# Patient Record
Sex: Female | Born: 1935
Health system: Southern US, Community
[De-identification: ages and names within clinical notes are randomized; demographics above are authoritative.]

## PROBLEM LIST (undated history)

## (undated) DIAGNOSIS — J189 Pneumonia, unspecified organism: Secondary | ICD-10-CM

## (undated) DIAGNOSIS — M109 Gout, unspecified: Secondary | ICD-10-CM

## (undated) DIAGNOSIS — R112 Nausea with vomiting, unspecified: Secondary | ICD-10-CM

## (undated) DIAGNOSIS — Z86718 Personal history of other venous thrombosis and embolism: Secondary | ICD-10-CM

## (undated) DIAGNOSIS — R609 Edema, unspecified: Secondary | ICD-10-CM

## (undated) DIAGNOSIS — E119 Type 2 diabetes mellitus without complications: Secondary | ICD-10-CM

## (undated) DIAGNOSIS — N189 Chronic kidney disease, unspecified: Secondary | ICD-10-CM

## (undated) DIAGNOSIS — K589 Irritable bowel syndrome without diarrhea: Secondary | ICD-10-CM

## (undated) DIAGNOSIS — R0600 Dyspnea, unspecified: Secondary | ICD-10-CM

## (undated) DIAGNOSIS — T4145XA Adverse effect of unspecified anesthetic, initial encounter: Secondary | ICD-10-CM

## (undated) DIAGNOSIS — E039 Hypothyroidism, unspecified: Secondary | ICD-10-CM

## (undated) DIAGNOSIS — Z9889 Other specified postprocedural states: Secondary | ICD-10-CM

## (undated) DIAGNOSIS — M1990 Unspecified osteoarthritis, unspecified site: Secondary | ICD-10-CM

## (undated) DIAGNOSIS — T8859XA Other complications of anesthesia, initial encounter: Secondary | ICD-10-CM

## (undated) DIAGNOSIS — K219 Gastro-esophageal reflux disease without esophagitis: Secondary | ICD-10-CM

## (undated) HISTORY — PX: HERNIA REPAIR: SHX51

## (undated) HISTORY — PX: ABDOMINAL HYSTERECTOMY: SHX81

## (undated) HISTORY — PX: DIAGNOSTIC LAPAROSCOPY: SUR761

## (undated) HISTORY — PX: JOINT REPLACEMENT: SHX530

## (undated) HISTORY — PX: VEIN LIGATION AND STRIPPING: SHX2653

---

## 2014-03-03 DIAGNOSIS — M1711 Unilateral primary osteoarthritis, right knee: Secondary | ICD-10-CM | POA: Insufficient documentation

## 2014-03-31 ENCOUNTER — Other Ambulatory Visit: Payer: Self-pay | Admitting: Orthopedic Surgery

## 2014-05-07 ENCOUNTER — Ambulatory Visit (HOSPITAL_COMMUNITY)
Admission: RE | Admit: 2014-05-07 | Discharge: 2014-05-07 | Disposition: A | Discharge status: Home / Self Care | Payer: Medicare Other | Source: Ambulatory Visit | Attending: Orthopedic Surgery | Admitting: Orthopedic Surgery

## 2014-05-07 ENCOUNTER — Encounter (HOSPITAL_COMMUNITY)
Admission: RE | Admit: 2014-05-07 | Discharge: 2014-05-07 | Disposition: A | Discharge status: Home / Self Care | Payer: Medicare Other | Source: Ambulatory Visit | Attending: Orthopedic Surgery | Admitting: Orthopedic Surgery

## 2014-05-07 ENCOUNTER — Encounter (HOSPITAL_COMMUNITY): Payer: Self-pay

## 2014-05-07 DIAGNOSIS — Z01818 Encounter for other preprocedural examination: Secondary | ICD-10-CM

## 2014-05-07 HISTORY — DX: Type 2 diabetes mellitus without complications: E11.9

## 2014-05-07 HISTORY — DX: Unspecified osteoarthritis, unspecified site: M19.90

## 2014-05-07 HISTORY — DX: Personal history of other venous thrombosis and embolism: Z86.718

## 2014-05-07 HISTORY — DX: Irritable bowel syndrome, unspecified: K58.9

## 2014-05-07 HISTORY — DX: Gastro-esophageal reflux disease without esophagitis: K21.9

## 2014-05-07 HISTORY — DX: Hypothyroidism, unspecified: E03.9

## 2014-05-07 LAB — COMPREHENSIVE METABOLIC PANEL
ALK PHOS: 79 U/L (ref 39–117)
ALT: 22 U/L (ref 0–35)
ANION GAP: 7 (ref 5–15)
AST: 28 U/L (ref 0–37)
Albumin: 4.2 g/dL (ref 3.5–5.2)
BUN: 22 mg/dL (ref 6–23)
CHLORIDE: 107 meq/L (ref 96–112)
CO2: 29 mmol/L (ref 19–32)
Calcium: 10.1 mg/dL (ref 8.4–10.5)
Creatinine, Ser: 1.23 mg/dL — ABNORMAL HIGH (ref 0.50–1.10)
GFR, EST AFRICAN AMERICAN: 48 mL/min — AB (ref >90)
GFR, EST NON AFRICAN AMERICAN: 41 mL/min — AB (ref >90)
GLUCOSE: 114 mg/dL — AB (ref 70–99)
POTASSIUM: 4.1 mmol/L (ref 3.5–5.1)
Sodium: 143 mmol/L (ref 135–145)
Total Bilirubin: 0.6 mg/dL (ref 0.3–1.2)
Total Protein: 7.2 g/dL (ref 6.0–8.3)

## 2014-05-07 LAB — CBC WITH DIFFERENTIAL/PLATELET
Basophils Absolute: 0 10*3/uL (ref 0.0–0.1)
Basophils Relative: 0 % (ref 0–1)
Eosinophils Absolute: 0.3 10*3/uL (ref 0.0–0.7)
Eosinophils Relative: 5 % (ref 0–5)
HCT: 41.1 % (ref 36.0–46.0)
HEMOGLOBIN: 14 g/dL (ref 12.0–15.0)
LYMPHS ABS: 1.7 10*3/uL (ref 0.7–4.0)
Lymphocytes Relative: 26 % (ref 12–46)
MCH: 31.5 pg (ref 26.0–34.0)
MCHC: 34.1 g/dL (ref 30.0–36.0)
MCV: 92.6 fL (ref 78.0–100.0)
MONOS PCT: 6 % (ref 3–12)
Monocytes Absolute: 0.4 10*3/uL (ref 0.1–1.0)
NEUTROS PCT: 63 % (ref 43–77)
Neutro Abs: 4.2 10*3/uL (ref 1.7–7.7)
Platelets: 148 10*3/uL — ABNORMAL LOW (ref 150–400)
RBC: 4.44 MIL/uL (ref 3.87–5.11)
RDW: 13.8 % (ref 11.5–15.5)
WBC: 6.6 10*3/uL (ref 4.0–10.5)

## 2014-05-07 LAB — PROTIME-INR
INR: 0.88 (ref 0.00–1.49)
Prothrombin Time: 12 seconds (ref 11.6–15.2)

## 2014-05-07 LAB — URINALYSIS, ROUTINE W REFLEX MICROSCOPIC
Bilirubin Urine: NEGATIVE
GLUCOSE, UA: NEGATIVE mg/dL
Hgb urine dipstick: NEGATIVE
KETONES UR: NEGATIVE mg/dL
Nitrite: NEGATIVE
PROTEIN: 30 mg/dL — AB
Specific Gravity, Urine: 1.016 (ref 1.005–1.030)
Urobilinogen, UA: 0.2 mg/dL (ref 0.0–1.0)
pH: 6 (ref 5.0–8.0)

## 2014-05-07 LAB — SURGICAL PCR SCREEN
MRSA, PCR: NEGATIVE
Staphylococcus aureus: NEGATIVE

## 2014-05-07 LAB — URINE MICROSCOPIC-ADD ON

## 2014-05-07 LAB — APTT: aPTT: 35 seconds (ref 24–37)

## 2014-05-07 NOTE — Pre-Procedure Instructions (Signed)
Amanda Warner  05/07/2014   Your procedure is scheduled on:  Monday, January 4th  Report to [facility] Admitting at 530 AM.  Call this number if you have problems the morning of surgery: [phone]   Remember:   Do not eat food or drink liquids after midnight.   Take these medicines the morning of surgery with A SIP OF WATER: synthroid, tylenol if needed, neurontin if needed   Do not wear jewelry, make-up or nail polish.  Do not wear lotions, powders, or perfumes, deodorant.  Do not shave 48 hours prior to surgery. Men may shave face and neck.  Do not bring valuables to the hospital.  [facility] is not responsible   for any belongings or valuables.               Contacts, dentures or bridgework may not be worn into surgery.  Leave suitcase in the car. After surgery it may be brought to your room.  For patients admitted to the hospital, discharge time is determined by your  treatment team.          Please read over the following fact sheets that you were given: Pain Booklet, Coughing and Deep Breathing, MRSA Information and Surgical Site Infection Prevention  [place] - Preparing for Surgery  Before surgery, you can play an important role.  Because skin is not sterile, your skin needs to be as free of germs as possible.  You can reduce the number of germs on you skin by washing with CHG (chlorahexidine gluconate) soap before surgery.  CHG is an antiseptic cleaner which kills germs and bonds with the skin to continue killing germs even after washing.  Please DO NOT use if you have an allergy to CHG or antibacterial soaps.  If your skin becomes reddened/irritated stop using the CHG and inform your nurse when you arrive at Short Stay.  Do not shave (including legs and underarms) for at least 48 hours prior to the first CHG shower.  You may shave your face.  Please follow these instructions carefully:   1.  Shower with CHG Soap the night before surgery and the morning of  Surgery.  2.  If you choose to wash your hair, wash your hair first as usual with your normal shampoo.  3.  After you shampoo, rinse your hair and body thoroughly to remove the shampoo.  4.  Use CHG as you would any other liquid soap.  You can apply CHG directly to the skin and wash gently with scrungie or a clean washcloth.  5.  Apply the CHG Soap to your body ONLY FROM THE NECK DOWN.  Do not use on open wounds or open sores.  Avoid contact with your eyes, ears, mouth and genitals (private parts).  Wash genitals (private parts) with your normal soap.  6.  Wash thoroughly, paying special attention to the area where your surgery will be performed.  7.  Thoroughly rinse your body with warm water from the neck down.  8.  DO NOT shower/wash with your normal soap after using and rinsing off the CHG Soap.  9.  Pat yourself dry with a clean towel.            10.  Wear clean pajamas.            11.  Place clean sheets on your bed the night of your first shower and do not sleep with pets.  Day of Surgery  Do not  apply any lotions/deoderants the morning of surgery.  Please wear clean clothes to the hospital/surgery center.

## 2014-05-18 MED ORDER — TRANEXAMIC ACID 100 MG/ML IV SOLN
1000.0000 mg | INTRAVENOUS | Status: AC
  Administered 2014-05-19: 1000 mg via INTRAVENOUS
  Filled 2014-05-18: qty 10

## 2014-05-18 MED ORDER — VANCOMYCIN HCL IN DEXTROSE 1-5 GM/200ML-% IV SOLN
1000.0000 mg | INTRAVENOUS | Status: AC
Start: 2014-05-19 — End: 2014-05-19
  Administered 2014-05-19: 1000 mg via INTRAVENOUS
  Filled 2014-05-18: qty 200

## 2014-05-18 MED ORDER — BUPIVACAINE LIPOSOME 1.3 % IJ SUSP
20.0000 mL | Freq: Once | INTRAMUSCULAR | Status: DC
  Filled 2014-05-18: qty 20

## 2014-05-19 ENCOUNTER — Inpatient Hospital Stay (HOSPITAL_COMMUNITY): Payer: Medicare Other | Admitting: Anesthesiology

## 2014-05-19 ENCOUNTER — Inpatient Hospital Stay (HOSPITAL_COMMUNITY)
Admission: RE | Admit: 2014-05-19 | Discharge: 2014-05-22 | DRG: 470 | Disposition: A | Discharge status: Home / Self Care | Payer: Medicare Other | Source: Ambulatory Visit | Attending: Orthopedic Surgery | Admitting: Orthopedic Surgery

## 2014-05-19 ENCOUNTER — Encounter (HOSPITAL_COMMUNITY): Payer: Self-pay | Admitting: Surgery

## 2014-05-19 ENCOUNTER — Encounter (HOSPITAL_COMMUNITY)
Admission: RE | Disposition: A | Discharge status: Home / Self Care | Payer: Self-pay | Source: Ambulatory Visit | Attending: Orthopedic Surgery

## 2014-05-19 DIAGNOSIS — Z881 Allergy status to other antibiotic agents status: Secondary | ICD-10-CM | POA: Diagnosis not present

## 2014-05-19 DIAGNOSIS — Z882 Allergy status to sulfonamides status: Secondary | ICD-10-CM | POA: Diagnosis not present

## 2014-05-19 DIAGNOSIS — M1711 Unilateral primary osteoarthritis, right knee: Secondary | ICD-10-CM | POA: Diagnosis not present

## 2014-05-19 DIAGNOSIS — D62 Acute posthemorrhagic anemia: Secondary | ICD-10-CM | POA: Diagnosis not present

## 2014-05-19 DIAGNOSIS — Z880 Allergy status to penicillin: Secondary | ICD-10-CM | POA: Diagnosis not present

## 2014-05-19 DIAGNOSIS — Z96659 Presence of unspecified artificial knee joint: Secondary | ICD-10-CM

## 2014-05-19 DIAGNOSIS — M25561 Pain in right knee: Secondary | ICD-10-CM | POA: Diagnosis not present

## 2014-05-19 DIAGNOSIS — K589 Irritable bowel syndrome without diarrhea: Secondary | ICD-10-CM | POA: Diagnosis present

## 2014-05-19 DIAGNOSIS — K219 Gastro-esophageal reflux disease without esophagitis: Secondary | ICD-10-CM | POA: Diagnosis present

## 2014-05-19 DIAGNOSIS — Z87891 Personal history of nicotine dependence: Secondary | ICD-10-CM | POA: Diagnosis not present

## 2014-05-19 DIAGNOSIS — Z885 Allergy status to narcotic agent status: Secondary | ICD-10-CM | POA: Diagnosis not present

## 2014-05-19 DIAGNOSIS — M179 Osteoarthritis of knee, unspecified: Secondary | ICD-10-CM | POA: Diagnosis not present

## 2014-05-19 DIAGNOSIS — Z90710 Acquired absence of both cervix and uterus: Secondary | ICD-10-CM | POA: Diagnosis not present

## 2014-05-19 DIAGNOSIS — Z7982 Long term (current) use of aspirin: Secondary | ICD-10-CM

## 2014-05-19 DIAGNOSIS — E039 Hypothyroidism, unspecified: Secondary | ICD-10-CM | POA: Diagnosis present

## 2014-05-19 DIAGNOSIS — G8918 Other acute postprocedural pain: Secondary | ICD-10-CM | POA: Diagnosis not present

## 2014-05-19 DIAGNOSIS — E119 Type 2 diabetes mellitus without complications: Secondary | ICD-10-CM | POA: Diagnosis present

## 2014-05-19 HISTORY — DX: Other specified postprocedural states: R11.2

## 2014-05-19 HISTORY — PX: TOTAL KNEE ARTHROPLASTY: SHX125

## 2014-05-19 HISTORY — DX: Other specified postprocedural states: Z98.890

## 2014-05-19 HISTORY — DX: Other complications of anesthesia, initial encounter: T88.59XA

## 2014-05-19 HISTORY — DX: Adverse effect of unspecified anesthetic, initial encounter: T41.45XA

## 2014-05-19 LAB — TYPE AND SCREEN
ABO/RH(D): A POS
Antibody Screen: NEGATIVE

## 2014-05-19 LAB — GLUCOSE, CAPILLARY
GLUCOSE-CAPILLARY: 129 mg/dL — AB (ref 70–99)
Glucose-Capillary: 115 mg/dL — ABNORMAL HIGH (ref 70–99)
Glucose-Capillary: 187 mg/dL — ABNORMAL HIGH (ref 70–99)
Glucose-Capillary: 154 mg/dL — ABNORMAL HIGH (ref 70–99)

## 2014-05-19 LAB — CBC
HCT: 36.2 % (ref 36.0–46.0)
Hemoglobin: 11.9 g/dL — ABNORMAL LOW (ref 12.0–15.0)
MCH: 29.9 pg (ref 26.0–34.0)
MCHC: 32.9 g/dL (ref 30.0–36.0)
MCV: 91 fL (ref 78.0–100.0)
PLATELETS: 121 10*3/uL — AB (ref 150–400)
RBC: 3.98 MIL/uL (ref 3.87–5.11)
RDW: 13.3 % (ref 11.5–15.5)
WBC: 7.3 10*3/uL (ref 4.0–10.5)

## 2014-05-19 LAB — ABO/RH: ABO/RH(D): A POS

## 2014-05-19 LAB — CREATININE, SERUM
CREATININE: 1.16 mg/dL — AB (ref 0.50–1.10)
GFR calc Af Amer: 51 mL/min — ABNORMAL LOW (ref >90)
GFR, EST NON AFRICAN AMERICAN: 44 mL/min — AB (ref >90)

## 2014-05-19 SURGERY — ARTHROPLASTY, KNEE, TOTAL
Anesthesia: Regional | Site: Knee | Laterality: Right

## 2014-05-19 MED ORDER — ACETAMINOPHEN 500 MG PO TABS
1000.0000 mg | ORAL_TABLET | Freq: Four times a day (QID) | ORAL | Status: AC
  Administered 2014-05-19 – 2014-05-20 (×3): 1000 mg via ORAL
  Filled 2014-05-19 (×3): qty 2

## 2014-05-19 MED ORDER — FUROSEMIDE 20 MG PO TABS
20.0000 mg | ORAL_TABLET | Freq: Every day | ORAL | Status: DC
  Administered 2014-05-20 – 2014-05-22 (×3): 20 mg via ORAL
  Filled 2014-05-19 (×4): qty 1

## 2014-05-19 MED ORDER — LIDOCAINE HCL (CARDIAC) 20 MG/ML IV SOLN
INTRAVENOUS | Status: AC
  Filled 2014-05-19: qty 5

## 2014-05-19 MED ORDER — ROCURONIUM BROMIDE 50 MG/5ML IV SOLN
INTRAVENOUS | Status: AC
  Filled 2014-05-19: qty 1

## 2014-05-19 MED ORDER — FENTANYL CITRATE 0.05 MG/ML IJ SOLN
INTRAMUSCULAR | Status: DC | PRN
  Administered 2014-05-19 (×2): 50 ug via INTRAVENOUS
  Administered 2014-05-19: 75 ug via INTRAVENOUS
  Administered 2014-05-19: 25 ug via INTRAVENOUS
  Administered 2014-05-19: 50 ug via INTRAVENOUS

## 2014-05-19 MED ORDER — ONDANSETRON HCL 4 MG/2ML IJ SOLN
INTRAMUSCULAR | Status: DC | PRN
  Administered 2014-05-19: 4 mg via INTRAVENOUS

## 2014-05-19 MED ORDER — ATORVASTATIN CALCIUM 20 MG PO TABS
20.0000 mg | ORAL_TABLET | Freq: Every day | ORAL | Status: DC
  Administered 2014-05-19 – 2014-05-21 (×3): 20 mg via ORAL
  Filled 2014-05-19 (×4): qty 1

## 2014-05-19 MED ORDER — OXYCODONE HCL 5 MG PO TABS
5.0000 mg | ORAL_TABLET | ORAL | Status: DC | PRN
  Administered 2014-05-19 – 2014-05-20 (×4): 10 mg via ORAL
  Filled 2014-05-19 (×5): qty 2

## 2014-05-19 MED ORDER — ROCURONIUM BROMIDE 100 MG/10ML IV SOLN
INTRAVENOUS | Status: DC | PRN
  Administered 2014-05-19: 15 mg via INTRAVENOUS

## 2014-05-19 MED ORDER — SODIUM CHLORIDE 0.9 % IJ SOLN
INTRAMUSCULAR | Status: AC
  Filled 2014-05-19: qty 10

## 2014-05-19 MED ORDER — DIPHENHYDRAMINE HCL 12.5 MG/5ML PO ELIX
12.5000 mg | ORAL_SOLUTION | ORAL | Status: DC | PRN
  Administered 2014-05-22: 25 mg via ORAL
  Filled 2014-05-19 (×2): qty 10

## 2014-05-19 MED ORDER — ONDANSETRON HCL 4 MG/2ML IJ SOLN
INTRAMUSCULAR | Status: AC
  Filled 2014-05-19: qty 2

## 2014-05-19 MED ORDER — LEVOTHYROXINE SODIUM 50 MCG PO TABS
50.0000 ug | ORAL_TABLET | Freq: Every day | ORAL | Status: DC
  Administered 2014-05-20 – 2014-05-21 (×3): 50 ug via ORAL
  Filled 2014-05-19 (×6): qty 1

## 2014-05-19 MED ORDER — MIDAZOLAM HCL 2 MG/2ML IJ SOLN
INTRAMUSCULAR | Status: AC
  Filled 2014-05-19: qty 2

## 2014-05-19 MED ORDER — BUPIVACAINE-EPINEPHRINE 0.5% -1:200000 IJ SOLN
INTRAMUSCULAR | Status: DC | PRN
  Administered 2014-05-19: 20 mL

## 2014-05-19 MED ORDER — ARTIFICIAL TEARS OP OINT
TOPICAL_OINTMENT | OPHTHALMIC | Status: DC | PRN
  Administered 2014-05-19: 1 via OPHTHALMIC

## 2014-05-19 MED ORDER — PROPOFOL 10 MG/ML IV BOLUS
INTRAVENOUS | Status: DC | PRN
  Administered 2014-05-19: 150 mg via INTRAVENOUS

## 2014-05-19 MED ORDER — ONDANSETRON HCL 4 MG/2ML IJ SOLN: 4.0000 mg | Freq: Once | INTRAMUSCULAR | Status: DC | PRN

## 2014-05-19 MED ORDER — ACETAMINOPHEN 650 MG RE SUPP: 650.0000 mg | Freq: Four times a day (QID) | RECTAL | Status: DC | PRN

## 2014-05-19 MED ORDER — HYDROMORPHONE HCL 1 MG/ML IJ SOLN
0.2500 mg | INTRAMUSCULAR | Status: DC | PRN
  Administered 2014-05-19 (×4): 0.5 mg via INTRAVENOUS

## 2014-05-19 MED ORDER — LIDOCAINE HCL (CARDIAC) 20 MG/ML IV SOLN
INTRAVENOUS | Status: DC | PRN
  Administered 2014-05-19: 80 mg via INTRAVENOUS

## 2014-05-19 MED ORDER — EPHEDRINE SULFATE 50 MG/ML IJ SOLN
INTRAMUSCULAR | Status: AC
  Filled 2014-05-19: qty 1

## 2014-05-19 MED ORDER — BUPIVACAINE LIPOSOME 1.3 % IJ SUSP
INTRAMUSCULAR | Status: DC | PRN
  Administered 2014-05-19: 20 mL

## 2014-05-19 MED ORDER — ZOLPIDEM TARTRATE 5 MG PO TABS
5.0000 mg | ORAL_TABLET | Freq: Every evening | ORAL | Status: DC | PRN
Start: 2014-05-19 — End: 2014-05-22

## 2014-05-19 MED ORDER — PHENOL 1.4 % MT LIQD: 1.0000 | OROMUCOSAL | Status: DC | PRN

## 2014-05-19 MED ORDER — POTASSIUM CHLORIDE ER 10 MEQ PO TBCR
10.0000 meq | EXTENDED_RELEASE_TABLET | Freq: Every day | ORAL | Status: DC
  Administered 2014-05-19 – 2014-05-21 (×3): 10 meq via ORAL
  Filled 2014-05-19 (×4): qty 1

## 2014-05-19 MED ORDER — TRANEXAMIC ACID 100 MG/ML IV SOLN
1000.0000 mg | INTRAVENOUS | Status: DC
  Filled 2014-05-19: qty 10

## 2014-05-19 MED ORDER — ACETAMINOPHEN 325 MG PO TABS
650.0000 mg | ORAL_TABLET | Freq: Four times a day (QID) | ORAL | Status: DC | PRN
Start: 2014-05-19 — End: 2014-05-22
  Administered 2014-05-20: 650 mg via ORAL
  Filled 2014-05-19: qty 2

## 2014-05-19 MED ORDER — PROPOFOL 10 MG/ML IV BOLUS
INTRAVENOUS | Status: AC
  Filled 2014-05-19: qty 20

## 2014-05-19 MED ORDER — SODIUM CHLORIDE 0.9 % IV SOLN: INTRAVENOUS | Status: DC

## 2014-05-19 MED ORDER — ONDANSETRON HCL 4 MG/2ML IJ SOLN
INTRAMUSCULAR | Status: AC
  Administered 2014-05-19: 4 mg via INTRAVENOUS
  Filled 2014-05-19: qty 2

## 2014-05-19 MED ORDER — DOCUSATE SODIUM 100 MG PO CAPS
100.0000 mg | ORAL_CAPSULE | Freq: Two times a day (BID) | ORAL | Status: DC
  Administered 2014-05-19 – 2014-05-22 (×6): 100 mg via ORAL
  Filled 2014-05-19 (×7): qty 1

## 2014-05-19 MED ORDER — SUCCINYLCHOLINE CHLORIDE 20 MG/ML IJ SOLN
INTRAMUSCULAR | Status: AC
  Filled 2014-05-19: qty 1

## 2014-05-19 MED ORDER — BUPIVACAINE-EPINEPHRINE (PF) 0.5% -1:200000 IJ SOLN
INTRAMUSCULAR | Status: AC
  Filled 2014-05-19: qty 30

## 2014-05-19 MED ORDER — ALUM & MAG HYDROXIDE-SIMETH 200-200-20 MG/5ML PO SUSP
30.0000 mL | ORAL | Status: DC | PRN
  Administered 2014-05-20: 30 mL via ORAL
  Filled 2014-05-19: qty 30

## 2014-05-19 MED ORDER — CHLORHEXIDINE GLUCONATE 4 % EX LIQD
60.0000 mL | Freq: Once | CUTANEOUS | Status: DC
  Filled 2014-05-19: qty 60

## 2014-05-19 MED ORDER — GABAPENTIN 100 MG PO CAPS
100.0000 mg | ORAL_CAPSULE | Freq: Two times a day (BID) | ORAL | Status: DC | PRN
  Administered 2014-05-20 – 2014-05-21 (×3): 100 mg via ORAL
  Filled 2014-05-19 (×5): qty 1

## 2014-05-19 MED ORDER — ENOXAPARIN SODIUM 30 MG/0.3ML ~~LOC~~ SOLN
30.0000 mg | Freq: Two times a day (BID) | SUBCUTANEOUS | Status: DC
  Administered 2014-05-20 – 2014-05-22 (×5): 30 mg via SUBCUTANEOUS
  Filled 2014-05-19 (×7): qty 0.3

## 2014-05-19 MED ORDER — HYDROMORPHONE HCL 1 MG/ML IJ SOLN
1.0000 mg | INTRAMUSCULAR | Status: DC | PRN
  Administered 2014-05-19 – 2014-05-21 (×10): 1 mg via INTRAVENOUS
  Filled 2014-05-19 (×12): qty 1

## 2014-05-19 MED ORDER — LACTATED RINGERS IV SOLN
INTRAVENOUS | Status: DC | PRN
  Administered 2014-05-19 (×2): via INTRAVENOUS

## 2014-05-19 MED ORDER — ARTIFICIAL TEARS OP OINT
TOPICAL_OINTMENT | OPHTHALMIC | Status: AC
  Filled 2014-05-19: qty 3.5

## 2014-05-19 MED ORDER — SODIUM CHLORIDE 0.9 % IV SOLN
INTRAVENOUS | Status: DC
  Administered 2014-05-19: 11:00:00 via INTRAVENOUS

## 2014-05-19 MED ORDER — METHOCARBAMOL 500 MG PO TABS
500.0000 mg | ORAL_TABLET | Freq: Four times a day (QID) | ORAL | Status: DC | PRN
  Administered 2014-05-20 – 2014-05-21 (×5): 500 mg via ORAL
  Filled 2014-05-19 (×6): qty 1

## 2014-05-19 MED ORDER — ONDANSETRON HCL 4 MG/2ML IJ SOLN
4.0000 mg | Freq: Four times a day (QID) | INTRAMUSCULAR | Status: DC | PRN
  Administered 2014-05-19 – 2014-05-20 (×2): 4 mg via INTRAVENOUS
  Filled 2014-05-19: qty 2

## 2014-05-19 MED ORDER — HYDROMORPHONE HCL 1 MG/ML IJ SOLN
INTRAMUSCULAR | Status: AC
  Filled 2014-05-19: qty 1

## 2014-05-19 MED ORDER — METHOCARBAMOL 1000 MG/10ML IJ SOLN
500.0000 mg | Freq: Four times a day (QID) | INTRAVENOUS | Status: DC | PRN
  Filled 2014-05-19: qty 5

## 2014-05-19 MED ORDER — FLEET ENEMA 7-19 GM/118ML RE ENEM: 1.0000 | ENEMA | Freq: Once | RECTAL | Status: AC | PRN

## 2014-05-19 MED ORDER — LIRAGLUTIDE 18 MG/3ML ~~LOC~~ SOPN: 1.8000 mL | PEN_INJECTOR | Freq: Every day | SUBCUTANEOUS | Status: DC

## 2014-05-19 MED ORDER — METOCLOPRAMIDE HCL 5 MG/ML IJ SOLN: 5.0000 mg | Freq: Three times a day (TID) | INTRAMUSCULAR | Status: DC | PRN

## 2014-05-19 MED ORDER — GLYCOPYRROLATE 0.2 MG/ML IJ SOLN
INTRAMUSCULAR | Status: AC
  Filled 2014-05-19: qty 2

## 2014-05-19 MED ORDER — FENTANYL CITRATE 0.05 MG/ML IJ SOLN
INTRAMUSCULAR | Status: AC
  Filled 2014-05-19: qty 5

## 2014-05-19 MED ORDER — VANCOMYCIN HCL IN DEXTROSE 1-5 GM/200ML-% IV SOLN
1000.0000 mg | Freq: Two times a day (BID) | INTRAVENOUS | Status: AC
  Administered 2014-05-19: 1000 mg via INTRAVENOUS
  Filled 2014-05-19: qty 200

## 2014-05-19 MED ORDER — BISACODYL 5 MG PO TBEC
5.0000 mg | DELAYED_RELEASE_TABLET | Freq: Every day | ORAL | Status: DC | PRN
Start: 2014-05-19 — End: 2014-05-22

## 2014-05-19 MED ORDER — SODIUM CHLORIDE 0.9 % IR SOLN
Status: DC | PRN
  Administered 2014-05-19: 3000 mL

## 2014-05-19 MED ORDER — OXYCODONE HCL ER 10 MG PO T12A
10.0000 mg | EXTENDED_RELEASE_TABLET | Freq: Two times a day (BID) | ORAL | Status: DC
  Administered 2014-05-19 – 2014-05-20 (×2): 10 mg via ORAL
  Filled 2014-05-19 (×2): qty 1

## 2014-05-19 MED ORDER — ONDANSETRON HCL 4 MG PO TABS: 4.0000 mg | ORAL_TABLET | Freq: Four times a day (QID) | ORAL | Status: DC | PRN

## 2014-05-19 MED ORDER — MENTHOL 3 MG MT LOZG: 1.0000 | LOZENGE | OROMUCOSAL | Status: DC | PRN

## 2014-05-19 MED ORDER — FEBUXOSTAT 40 MG PO TABS
40.0000 mg | ORAL_TABLET | ORAL | Status: DC
  Administered 2014-05-21: 40 mg via ORAL
  Filled 2014-05-19 (×2): qty 1

## 2014-05-19 MED ORDER — METOCLOPRAMIDE HCL 10 MG PO TABS
5.0000 mg | ORAL_TABLET | Freq: Three times a day (TID) | ORAL | Status: DC | PRN
Start: 2014-05-19 — End: 2014-05-22
  Administered 2014-05-19 – 2014-05-21 (×2): 5 mg via ORAL
  Filled 2014-05-19 (×2): qty 1

## 2014-05-19 MED ORDER — SENNOSIDES-DOCUSATE SODIUM 8.6-50 MG PO TABS: 1.0000 | ORAL_TABLET | Freq: Every evening | ORAL | Status: DC | PRN

## 2014-05-19 SURGICAL SUPPLY — 60 items
BANDAGE ESMARK 6X9 LF (GAUZE/BANDAGES/DRESSINGS) ×1 IMPLANT
BLADE PATELLA REAM PILOT HOLE (BLADE) ×3 IMPLANT
BLADE SAGITTAL 13X1.27X60 (BLADE) ×2 IMPLANT
BLADE SAGITTAL 13X1.27X60MM (BLADE) ×1
BLADE SAW SGTL 83.5X18.5 (BLADE) ×3 IMPLANT
BLADE SURG 10 STRL SS (BLADE) ×3 IMPLANT
BNDG ESMARK 6X9 LF (GAUZE/BANDAGES/DRESSINGS) ×3
BOWL SMART MIX CTS (DISPOSABLE) ×3 IMPLANT
CAPT KNEE TOTAL 3 ×3 IMPLANT
CEMENT BONE SIMPLEX SPEEDSET (Cement) ×6 IMPLANT
COVER SURGICAL LIGHT HANDLE (MISCELLANEOUS) ×3 IMPLANT
CUFF TOURNIQUET SINGLE 34IN LL (TOURNIQUET CUFF) ×3 IMPLANT
DRAPE EXTREMITY T 121X128X90 (DRAPE) ×3 IMPLANT
DRAPE IMP U-DRAPE 54X76 (DRAPES) ×3 IMPLANT
DRAPE INCISE IOBAN 66X45 STRL (DRAPES) ×6 IMPLANT
DRAPE PROXIMA HALF (DRAPES) IMPLANT
DRAPE U-SHAPE 47X51 STRL (DRAPES) ×3 IMPLANT
DRSG ADAPTIC 3X8 NADH LF (GAUZE/BANDAGES/DRESSINGS) ×3 IMPLANT
DRSG PAD ABDOMINAL 8X10 ST (GAUZE/BANDAGES/DRESSINGS) ×6 IMPLANT
DURAPREP 26ML APPLICATOR (WOUND CARE) ×6 IMPLANT
ELECT REM PT RETURN 9FT ADLT (ELECTROSURGICAL) ×3
ELECTRODE REM PT RTRN 9FT ADLT (ELECTROSURGICAL) ×1 IMPLANT
EVACUATOR 1/8 PVC DRAIN (DRAIN) ×3 IMPLANT
GAUZE SPONGE 4X4 12PLY STRL (GAUZE/BANDAGES/DRESSINGS) ×3 IMPLANT
GLOVE BIOGEL M 7.0 STRL (GLOVE) IMPLANT
GLOVE BIOGEL PI IND STRL 7.5 (GLOVE) IMPLANT
GLOVE BIOGEL PI IND STRL 8.5 (GLOVE) ×2 IMPLANT
GLOVE BIOGEL PI INDICATOR 7.5 (GLOVE)
GLOVE BIOGEL PI INDICATOR 8.5 (GLOVE) ×4
GLOVE SURG ORTHO 8.0 STRL STRW (GLOVE) ×6 IMPLANT
GOWN STRL REUS W/ TWL LRG LVL3 (GOWN DISPOSABLE) ×1 IMPLANT
GOWN STRL REUS W/ TWL XL LVL3 (GOWN DISPOSABLE) ×2 IMPLANT
GOWN STRL REUS W/TWL LRG LVL3 (GOWN DISPOSABLE) ×2
GOWN STRL REUS W/TWL XL LVL3 (GOWN DISPOSABLE) ×4
HANDPIECE INTERPULSE COAX TIP (DISPOSABLE) ×2
HOOD PEEL AWAY FACE SHEILD DIS (HOOD) ×9 IMPLANT
KIT BASIN OR (CUSTOM PROCEDURE TRAY) ×3 IMPLANT
KIT ROOM TURNOVER OR (KITS) ×3 IMPLANT
KNEE CAPITATED TOTAL 3 ×1 IMPLANT
MANIFOLD NEPTUNE II (INSTRUMENTS) ×3 IMPLANT
NEEDLE 22X1 1/2 (OR ONLY) (NEEDLE) ×6 IMPLANT
NS IRRIG 1000ML POUR BTL (IV SOLUTION) ×3 IMPLANT
PACK TOTAL JOINT (CUSTOM PROCEDURE TRAY) ×3 IMPLANT
PACK UNIVERSAL I (CUSTOM PROCEDURE TRAY) ×3 IMPLANT
PAD ARMBOARD 7.5X6 YLW CONV (MISCELLANEOUS) ×6 IMPLANT
PADDING CAST COTTON 6X4 STRL (CAST SUPPLIES) ×3 IMPLANT
SET HNDPC FAN SPRY TIP SCT (DISPOSABLE) ×1 IMPLANT
SPONGE GAUZE 4X4 12PLY STER LF (GAUZE/BANDAGES/DRESSINGS) ×3 IMPLANT
STAPLER VISISTAT 35W (STAPLE) ×3 IMPLANT
SUCTION FRAZIER TIP 10 FR DISP (SUCTIONS) ×3 IMPLANT
SUT BONE WAX W31G (SUTURE) ×3 IMPLANT
SUT VIC AB 0 CTB1 27 (SUTURE) ×6 IMPLANT
SUT VIC AB 1 CT1 27 (SUTURE) ×4
SUT VIC AB 1 CT1 27XBRD ANBCTR (SUTURE) ×2 IMPLANT
SUT VIC AB 2-0 CT1 27 (SUTURE) ×4
SUT VIC AB 2-0 CT1 TAPERPNT 27 (SUTURE) ×2 IMPLANT
SYR 20CC LL (SYRINGE) ×6 IMPLANT
TOWEL OR 17X24 6PK STRL BLUE (TOWEL DISPOSABLE) ×3 IMPLANT
TOWEL OR 17X26 10 PK STRL BLUE (TOWEL DISPOSABLE) ×3 IMPLANT
WATER STERILE IRR 1000ML POUR (IV SOLUTION) ×6 IMPLANT

## 2014-05-19 NOTE — Anesthesia Preprocedure Evaluation (Signed)
Anesthesia Evaluation  Patient identified by MRN, date of birth, ID band Patient awake    Reviewed: Allergy & Precautions, H&P , NPO status , Patient's Chart, lab work & pertinent test results  Airway        Dental   Pulmonary former smoker,          Cardiovascular hypertension,     Neuro/Psych    GI/Hepatic GERD-  ,  Endo/Other  diabetes, Type 2Hypothyroidism   Renal/GU      Musculoskeletal  (+) Arthritis -,   Abdominal   Peds  Hematology   Anesthesia Other Findings   Reproductive/Obstetrics                             Anesthesia Physical Anesthesia Plan  ASA: II  Anesthesia Plan: General   Post-op Pain Management:    Induction: Intravenous  Airway Management Planned: Oral ETT  Additional Equipment:   Intra-op Plan:   Post-operative Plan: Extubation in OR  Informed Consent: I have reviewed the patients History and Physical, chart, labs and discussed the procedure including the risks, benefits and alternatives for the proposed anesthesia with the patient or authorized representative who has indicated his/her understanding and acceptance.     Plan Discussed with: CRNA, Anesthesiologist and Surgeon  Anesthesia Plan Comments:         Anesthesia Quick Evaluation

## 2014-05-19 NOTE — Anesthesia Postprocedure Evaluation (Signed)
  Anesthesia Post-op Note  Patient: Amanda Warner  Procedure(s) Performed: Procedure(s): RIGHT TOTAL KNEE ARTHROPLASTY (Right)  Patient Location: PACU  Anesthesia Type:GA combined with regional for post-op pain  Level of Consciousness: awake, alert , oriented and patient cooperative  Airway and Oxygen Therapy: Patient Spontanous Breathing  Post-op Pain: mild  Post-op Assessment: Post-op Vital signs reviewed, Patient's Cardiovascular Status Stable, Respiratory Function Stable, Patent Airway, No signs of Nausea or vomiting and Pain level controlled  Post-op Vital Signs: stable  Last Vitals:  Filed Vitals:   05/19/14 1100  BP: 129/68  Pulse: 64  Temp: 36.8 C  Resp: 13    Complications: No apparent anesthesia complications

## 2014-05-19 NOTE — Progress Notes (Signed)
Orthopedic Tech Progress Note Patient Details:  Amanda Warner [birth year] [id number]  CPM Right Knee CPM Right Knee: On Right Knee Flexion (Degrees): 90 Right Knee Extension (Degrees): 0 Additional Comments: trapeze bar patient helper Viewed order from doctor's order list  [name] 05/19/2014, 10:20 AM

## 2014-05-19 NOTE — Progress Notes (Signed)
Patient urinated already. Unable to collect urine culture at this time.

## 2014-05-19 NOTE — Progress Notes (Signed)
Utilization review completed.  

## 2014-05-19 NOTE — Transfer of Care (Signed)
Immediate Anesthesia Transfer of Care Note  Patient: Amanda Warner  Procedure(s) Performed: Procedure(s): RIGHT TOTAL KNEE ARTHROPLASTY (Right)  Patient Location: PACU  Anesthesia Type:General and Regional  Level of Consciousness: awake, alert , oriented and sedated  Airway & Oxygen Therapy: Patient Spontanous Breathing and Patient connected to nasal cannula oxygen  Post-op Assessment: Report given to PACU RN, Post -op Vital signs reviewed and stable and Patient moving all extremities  Post vital signs: Reviewed and stable  Complications: No apparent anesthesia complications

## 2014-05-19 NOTE — Evaluation (Signed)
Physical Therapy Evaluation Patient Details Name: Amanda Warner MRN: [id number] DOB: February 01, 1935 Today's Date: 05/19/2014   History of Present Illness  Pt admitted for R TKA with PMHx of L TKA 13 years ago  Clinical Impression  Pt pleasant and moving well POD#0. Pt with prior L TKA. Pt educated for precautions, HEP and plan with HEP given. Pt will benefit from acute therapy to maximize mobility, ROM and strength to return pt to independent level of function. Will follow. Pt encouraged to perform HEP throughout the day.     Follow Up Recommendations Home health PT    Equipment Recommendations  None recommended by PT    Recommendations for Other Services       Precautions / Restrictions Precautions Precautions: Knee Restrictions Weight Bearing Restrictions: Yes RLE Weight Bearing: Weight bearing as tolerated      Mobility  Bed Mobility Overal bed mobility: Needs Assistance Bed Mobility: Supine to Sit     Supine to sit: Supervision     General bed mobility comments: cues for sequence with assist for line management with HOB 20degrees  Transfers Overall transfer level: Needs assistance   Transfers: Sit to/from Stand Sit to Stand: Supervision         General transfer comment: cues for hand and RLE placement with transfers x 2 trials  Ambulation/Gait Ambulation/Gait assistance: Min guard Ambulation Distance (Feet): 15 Feet (x 2 trials) Assistive device: Rolling walker (2 wheeled) Gait Pattern/deviations: Step-to pattern;Decreased stance time - right   Gait velocity interpretation: Below normal speed for age/gender General Gait Details: cues for sequence with decreased weight bearing on RLE  Stairs            Wheelchair Mobility    Modified Rankin (Stroke Patients Only)       Balance Overall balance assessment: Needs assistance   Sitting balance-Leahy Scale: Good       Standing balance-Leahy Scale: Fair                                Pertinent Vitals/Pain Pain Assessment: No/denies pain    Home Living Family/patient expects to be discharged to:: Private residence Living Arrangements: Spouse/significant other Available Help at Discharge: Family;Available 24 hours/day Type of Home: House Home Access: Stairs to enter   [employer] of Steps: 3 Home Layout: One level Home Equipment: Walker - 2 wheels;Cane - single point;Shower seat      Prior Function Level of Independence: Independent               Hand Dominance        Extremity/Trunk Assessment   Upper Extremity Assessment: Overall WFL for tasks assessed           Lower Extremity Assessment: RLE deficits/detail RLE Deficits / Details: limited ROM as expected post op    Cervical / Trunk Assessment: Normal;Other exceptions  Communication   Communication: No difficulties  Cognition Arousal/Alertness: Awake/alert Behavior During Therapy: WFL for tasks assessed/performed Overall Cognitive Status: Within Functional Limits for tasks assessed                      General Comments      Exercises Total Joint Exercises Ankle Circles/Pumps: AROM;Both;5 reps;Seated Quad Sets: AROM;Seated;Right;5 reps Heel Slides: AAROM;Seated;Right;5 reps      Assessment/Plan    PT Assessment Patient needs continued PT services  PT Diagnosis Difficulty walking   PT Problem List Decreased strength;Decreased range of motion;Decreased  activity tolerance;Decreased mobility;Decreased knowledge of use of DME;Decreased knowledge of precautions  PT Treatment Interventions DME instruction;Gait training;Stair training;Functional mobility training;Therapeutic activities;Therapeutic exercise;Patient/family education   PT Goals (Current goals can be found in the Care Plan section) Acute Rehab PT Goals Patient Stated Goal: return to water aerobics PT Goal Formulation: With patient Time For Goal Achievement: 05/26/14 Potential to Achieve Goals:  Good    Frequency 7X/week   Barriers to discharge        Co-evaluation               End of Session   Activity Tolerance: Patient tolerated treatment well Patient left: in chair;with call bell/phone within reach Nurse Communication: Mobility status;Precautions;Weight bearing status         Time: 1331-1355 PT Time Calculation (min) (ACUTE ONLY): 24 min   Charges:   PT Evaluation $Initial PT Evaluation Tier I: 1 Procedure PT Treatments $Gait Training: 8-22 mins   PT G CodesMelford Aase 05/19/2014, 2:01 PM Elwyn Reach, [place]

## 2014-05-19 NOTE — Progress Notes (Signed)
Orthopedic Tech Progress Note Patient Details:  Amanda Warner [birth year] [id number] On cpm at 7:20 pm  Patient ID: Amanda Warner, female   DOB: [birth year], 80 y.o.   MRN: [id number]   Amanda Warner 05/19/2014, 7:22 PM

## 2014-05-19 NOTE — H&P (Signed)
Amanda Warner MRN:  [id number] DOB/SEX:  [birth year]/female  CHIEF COMPLAINT:  Painful right Knee  HISTORY: Patient is a 80 y.o. female presented with a history of pain in the right knee. Onset of symptoms was gradual starting several years ago with gradually worsening course since that time. Prior procedures on the knee include none. Patient has been treated conservatively with over-the-counter NSAIDs and activity modification. Patient currently rates pain in the knee at 10 out of 10 with activity. There is pain at night.  PAST MEDICAL HISTORY: There are no active problems to display for this patient.  Past Medical History  Diagnosis Date  . Arthritis   . Diabetes mellitus without complication   . Hypothyroidism   . GERD (gastroesophageal reflux disease)   . Hx of blood clots     lt leg  . IBS (irritable bowel syndrome)    Past Surgical History  Procedure Laterality Date  . Hernia repair    . Abdominal hysterectomy    . Joint replacement      knee  . Diagnostic laparoscopy       MEDICATIONS:   Prescriptions prior to admission  Medication Sig Dispense Refill Last Dose  . acetaminophen (TYLENOL) 500 MG tablet Take 500 mg by mouth every 6 (six) hours as needed (for pain).   05/19/2014 at 0330  . aspirin EC 81 MG tablet Take 81 mg by mouth daily.   05/18/2014 at 2000  . atorvastatin (LIPITOR) 40 MG tablet Take 20 mg by mouth at bedtime.   05/18/2014 at Unknown time  . febuxostat (ULORIC) 40 MG tablet Take 40 mg by mouth every other day.    Past Week at Unknown time  . furosemide (LASIX) 40 MG tablet Take 20 mg by mouth daily.   05/18/2014 at Unknown time  . gabapentin (NEURONTIN) 100 MG capsule Take 100 mg by mouth 2 (two) times daily as needed (for pain).   05/19/2014 at 0330  . levothyroxine (SYNTHROID, LEVOTHROID) 50 MCG tablet Take 50 mcg by mouth daily before breakfast.   05/18/2014 at Unknown time  . Liraglutide (VICTOZA) 18 MG/3ML SOPN Inject 1.8 mLs into the skin daily.   05/18/2014 at  Unknown time  . Polyethylene Glycol 3350 (MIRALAX PO) Take by mouth 2 (two) times daily as needed.   05/18/2014 at Unknown time  . potassium chloride (K-DUR) 10 MEQ tablet Take 10 mEq by mouth at bedtime.   05/18/2014 at Unknown time  . Probiotic Product (PROBIOTIC PO) Take 1 capsule by mouth daily.   05/18/2014 at Unknown time  . sennosides-docusate sodium (SENOKOT-S) 8.6-50 MG tablet Take 1 tablet by mouth daily as needed for constipation.   05/18/2014 at Unknown time  . Vitamin D, Ergocalciferol, (DRISDOL) 50000 UNITS CAPS capsule Take 50,000 Units by mouth every Sunday.   05/18/2014 at Unknown time  . Levothyroxine Sodium (LEVOXYL PO) Take by mouth.       ALLERGIES:   Allergies  Allergen Reactions  . Penicillins Anaphylaxis and Swelling  . Allopurinol     Liver damage  . Codeine Hives and Other (See Comments)    Sedation   . Sulfa Antibiotics Hives    REVIEW OF SYSTEMS:  A comprehensive review of systems was negative.   FAMILY HISTORY:  History reviewed. No pertinent family history.  SOCIAL HISTORY:   History  Substance Use Topics  . Smoking status: Former Research scientist (life sciences)  . Smokeless tobacco: Not on file  . Alcohol Use: Yes     Comment: occ  EXAMINATION:  Vital signs in last 24 hours: Temp:  [97.8 F (36.6 C)] 97.8 F (36.6 C) (01/04 0601) Resp:  [20] 20 (01/04 0601) BP: (147)/(74) 147/74 mmHg (01/04 0601) SpO2:  [98 %] 98 % (01/04 0601) Weight:  [78.926 kg (174 lb)] 78.926 kg (174 lb) (01/04 0601)  General appearance: alert, cooperative and no distress Lungs: clear to auscultation bilaterally Heart: regular rate and rhythm, S1, S2 normal, no murmur, click, rub or gallop Abdomen: soft, non-tender; bowel sounds normal; no masses,  no organomegaly Extremities: extremities normal, atraumatic, no cyanosis or edema and Homans sign is negative, no sign of DVT Pulses: 2+ and symmetric Skin: Skin color, texture, turgor normal. No rashes or lesions Neurologic: Alert and oriented X 3,  normal strength and tone. Normal symmetric reflexes. Normal coordination and gait  Musculoskeletal:  ROM 0-110, Ligaments intact,  Imaging Review Plain radiographs demonstrate severe degenerative joint disease of the right knee. The overall alignment is significant varus. The bone quality appears to be good for age and reported activity level.  Assessment/Plan: Primary osteoarthritis, right knee   The patient history, physical examination and imaging studies are consistent with advanced degenerative joint disease of the right knee. The patient has failed conservative treatment.  The clearance notes were reviewed.  After discussion with the patient it was felt that Total Knee Replacement was indicated. The procedure,  risks, and benefits of total knee arthroplasty were presented and reviewed. The risks including but not limited to aseptic loosening, infection, blood clots, vascular injury, stiffness, patella tracking problems complications among others were discussed. The patient acknowledged the explanation, agreed to proceed with the plan.  Amanda Warner 05/19/2014, 6:29 AM

## 2014-05-19 NOTE — Anesthesia Procedure Notes (Addendum)
Anesthesia Regional Block:  Adductor canal block  Pre-Anesthetic Checklist: ,, timeout performed, Correct Patient, Correct Site, Correct Laterality, Correct Procedure, Correct Position, site marked, Risks and benefits discussed,  Surgical consent,  Pre-op evaluation,  At surgeon's request and post-op pain management  Laterality: Right  Prep: Maximum Sterile Barrier Precautions used, chloraprep and alcohol swabs       Needles:  Injection technique: Single-shot  Needle Type: Stimulator Needle - 80          Additional Needles:  Procedures: Doppler guided Adductor canal block Narrative:  Start time: 05/19/2014 7:00 AM End time: 05/19/2014 7:10 AM Injection made incrementally with aspirations every 5 mL.  Performed by: Personally   Additional Notes: Pt accepts procedure w/ risks. 20cc 0.5% Marcaine w/ epi w/o difficulty or discomfort. GES   Procedure Name: Intubation Date/Time: 05/19/2014 7:37 AM Performed by: Scheryl Darter Pre-anesthesia Checklist: Patient identified, Emergency Drugs available, Suction available, Patient being monitored and Timeout performed Patient Re-evaluated:Patient Re-evaluated prior to inductionOxygen Delivery Method: Circle system utilized Preoxygenation: Pre-oxygenation with 100% oxygen Intubation Type: IV induction Ventilation: Mask ventilation without difficulty Laryngoscope Size: Mac and 3 Grade View: Grade I Tube type: Oral Number of attempts: 1 Airway Equipment and Method: Stylet Secured at: 22 cm Tube secured with: Tape Dental Injury: Teeth and Oropharynx as per pre-operative assessment

## 2014-05-20 ENCOUNTER — Encounter (HOSPITAL_COMMUNITY): Payer: Self-pay | Admitting: Orthopedic Surgery

## 2014-05-20 LAB — CBC
HEMATOCRIT: 31.9 % — AB (ref 36.0–46.0)
HEMOGLOBIN: 10.6 g/dL — AB (ref 12.0–15.0)
MCH: 30.3 pg (ref 26.0–34.0)
MCHC: 33.2 g/dL (ref 30.0–36.0)
MCV: 91.1 fL (ref 78.0–100.0)
PLATELETS: 110 10*3/uL — AB (ref 150–400)
RBC: 3.5 MIL/uL — AB (ref 3.87–5.11)
RDW: 13.6 % (ref 11.5–15.5)
WBC: 8 10*3/uL (ref 4.0–10.5)

## 2014-05-20 LAB — GLUCOSE, CAPILLARY
GLUCOSE-CAPILLARY: 143 mg/dL — AB (ref 70–99)
Glucose-Capillary: 134 mg/dL — ABNORMAL HIGH (ref 70–99)
Glucose-Capillary: 162 mg/dL — ABNORMAL HIGH (ref 70–99)

## 2014-05-20 LAB — BASIC METABOLIC PANEL
Anion gap: 7 (ref 5–15)
BUN: 19 mg/dL (ref 6–23)
CHLORIDE: 113 meq/L — AB (ref 96–112)
CO2: 22 mmol/L (ref 19–32)
CREATININE: 0.86 mg/dL (ref 0.50–1.10)
Calcium: 8.2 mg/dL — ABNORMAL LOW (ref 8.4–10.5)
GFR calc non Af Amer: 64 mL/min — ABNORMAL LOW (ref >90)
GFR, EST AFRICAN AMERICAN: 74 mL/min — AB (ref >90)
Glucose, Bld: 127 mg/dL — ABNORMAL HIGH (ref 70–99)
POTASSIUM: 3.6 mmol/L (ref 3.5–5.1)
Sodium: 142 mmol/L (ref 135–145)

## 2014-05-20 MED ORDER — ONDANSETRON HCL 4 MG/2ML IJ SOLN: 4.0000 mg | INTRAMUSCULAR | Status: DC | PRN

## 2014-05-20 MED ORDER — LIRAGLUTIDE 18 MG/3ML ~~LOC~~ SOPN
1.8000 mL | PEN_INJECTOR | Freq: Every day | SUBCUTANEOUS | Status: DC
  Administered 2014-05-20 – 2014-05-21 (×3): 1.8 mL via SUBCUTANEOUS

## 2014-05-20 MED ORDER — ONDANSETRON HCL 4 MG PO TABS
4.0000 mg | ORAL_TABLET | ORAL | Status: DC | PRN
  Administered 2014-05-20: 4 mg via ORAL
  Filled 2014-05-20: qty 1

## 2014-05-20 MED ORDER — INSULIN ASPART 100 UNIT/ML ~~LOC~~ SOLN: 0.0000 [IU] | Freq: Three times a day (TID) | SUBCUTANEOUS | Status: DC

## 2014-05-20 MED ORDER — HYDROMORPHONE HCL 2 MG PO TABS
4.0000 mg | ORAL_TABLET | ORAL | Status: DC | PRN
  Administered 2014-05-20 – 2014-05-22 (×10): 4 mg via ORAL
  Filled 2014-05-20 (×12): qty 2

## 2014-05-20 NOTE — Progress Notes (Signed)
CARE MANAGEMENT NOTE 05/20/2014  Patient:  Amanda Warner, Amanda Warner   Account Number:  [id number]  Date Initiated:  05/19/2014  Documentation initiated by:  [facility]  Subjective/Objective Assessment:   RIGHT TOTAL KNEE ARTHROPLASTY     Action/Plan:   lives at home with husband   Anticipated DC Date:  05/21/2014   Anticipated DC Plan:  [place]  CM consult      [facility] Choice  HOME HEALTH   Choice offered to / List presented to:  C-1 Patient   DME arranged  CPM      DME agency  [employer]     [place] arranged  HH-2 PT      [place]   Status of service:  Completed, signed off Medicare Important Message given?  NA - LOS <3 / Initial given by admissions (If response is "NO", the following Medicare IM given date fields will be blank) Date Medicare IM given:   Medicare IM given by:   Date Additional Medicare IM given:   Additional Medicare IM given by:    Discharge Disposition:  [place]  Per UR Regulation:    If discussed at Long Length of Stay Meetings, dates discussed:    Comments:  05/20/2014 1130 NCM spoke to pt and offered choice for [facility]. Pt agreeable to [facility] for [facility]. States [employer] will deliver her CPM to home. She has RW and 3n1 at home. Jonnie Finner RN CCM Case Mgmt phone [phone]

## 2014-05-20 NOTE — Progress Notes (Signed)
Physical Therapy Treatment Patient Details Name: Amanda Warner MRN: [id number] DOB: [birth year] Today's Date: 05/20/2014    History of Present Illness Pt admitted for R TKA with PMHx of L TKA 13 years ago    PT Comments    Pt had IV pain medicine 30 minutes prior to arrival. She was willing to work with PT, however even with sitting EOB, she remained drowsy and had incr nausea with activity. Husband present. Pt was unable to fully attend to exercises (performed in sitting for incr arousal). Pt wanted to sit up in recliner due to "indigestion" and could not persuade her to return to bed for CPM use. RN made aware and to give pt meds for indigestion. Unable to complete stair training this pm.   Follow Up Recommendations  Home health PT;Supervision/Assistance - 24 hour     Equipment Recommendations  None recommended by PT    Recommendations for Other Services       Precautions / Restrictions Precautions Precautions: Knee Precaution Booklet Issued: No Precaution Comments: reviewed no pivoting/twisting and no pillow Restrictions Weight Bearing Restrictions: Yes RLE Weight Bearing: Weight bearing as tolerated    Mobility  Bed Mobility Overal bed mobility: Needs Assistance Bed Mobility: Supine to Sit     Supine to sit: Min assist     General bed mobility comments: pt with difficulty sequencing due to drowsiness/decr attention  Transfers Overall transfer level: Needs assistance Equipment used: Rolling walker (2 wheeled) Transfers: Sit to/from Stand Sit to Stand: Min assist         General transfer comment: cues for hand and RLE placement with transfer  Ambulation/Gait Ambulation/Gait assistance: Min assist Ambulation Distance (Feet): 4 Feet Assistive device: Rolling walker (2 wheeled) Gait Pattern/deviations: Step-to pattern   Gait velocity interpretation: Below normal speed for age/gender General Gait Details: cues for sequence with decreased weight bearing on RLE and  incr reliance on UEs   Stairs            Wheelchair Mobility    Modified Rankin (Stroke Patients Only)       Balance             Standing balance-Leahy Scale: Fair                      Cognition Arousal/Alertness: Lethargic;Suspect due to medications (had IV pain meds prior) Behavior During Therapy: WFL for tasks assessed/performed Overall Cognitive Status: Impaired/Different from baseline Area of Impairment: Attention   Current Attention Level: Sustained           General Comments: drowsy from pain meds; difficult to keep her on task    Exercises Total Joint Exercises Long Arc Quad: [name];Right;Other reps (comment) Knee Flexion: AAROM;Right;5 reps;Seated (30-60 second hold each time) Goniometric ROM: 10-60 flexion    General Comments General comments (skin integrity, edema, etc.): husband present and reports pt has "gone downhill this afternoon."       Pertinent Vitals/Pain Pain Assessment: 0-10 Pain Score: 5  Pain Location: knee Pain Descriptors / Indicators: Aching;Constant Pain Intervention(s): Limited activity within patient's tolerance;Monitored during session;Premedicated before session;Repositioned;Ice applied    Home Living Family/patient expects to be discharged to:: Private residence Living Arrangements: Spouse/significant other Available Help at Discharge: Family;Available 24 hours/day Type of Home: House Home Access: Stairs to enter Entrance Stairs-Rails: None Home Layout: One level Home Equipment: Environmental consultant - 2 wheels;Cane - single point;Bedside commode;Shower seat;Grab bars - tub/shower      Prior Function Level of Independence: Independent  PT Goals (current goals can now be found in the care plan section) Acute Rehab PT Goals Patient Stated Goal: return to water aerobics Progress towards PT goals: Not progressing toward goals - comment (nausea, drowsy from meds)    Frequency  7X/week    PT Plan Current  plan remains appropriate    Co-evaluation             End of Session Equipment Utilized During Treatment: Gait belt Activity Tolerance: Treatment limited secondary to medical complications (Comment) (nauseous, drowsy) Patient left: in chair;with call bell/phone within reach;with family/visitor present     Time: 0037-0488 PT Time Calculation (min) (ACUTE ONLY): 23 min  Charges:  $Gait Training: 8-22 mins $Therapeutic Exercise: 8-22 mins                    G Codes:      [name] June 07, 2014, 4:11 PM Pager [phone]

## 2014-05-20 NOTE — Progress Notes (Signed)
Physical Therapy Treatment Patient Details Name: Amanda Warner MRN: [id number] DOB: [birth year] Today's Date: 05/20/2014    History of Present Illness Pt admitted for R TKA with PMHx of L TKA 13 years ago    PT Comments    Patient very motivated and limited by nausea this session. Requires vc for safe use of RW (including during transfers, sequencing with gait, technique with turning). Discussed options for home entry and will attempt stairs this pm as nausea permits.   Follow Up Recommendations  Home health PT;Supervision/Assistance - 24 hour     Equipment Recommendations  None recommended by PT    Recommendations for Other Services       Precautions / Restrictions Precautions Precautions: Knee Precaution Booklet Issued: No Precaution Comments: reviewed no pivoting/twisting and no pillow Restrictions Weight Bearing Restrictions: Yes RLE Weight Bearing: Weight bearing as tolerated    Mobility  Bed Mobility Overal bed mobility: Needs Assistance Bed Mobility: Supine to Sit     Supine to sit: Supervision     General bed mobility comments: bed elevated to simulate home; supervision for safety due to bed height and slick sheets  Transfers Overall transfer level: Needs assistance Equipment used: Rolling walker (2 wheeled) Transfers: Sit to/from Stand Sit to Stand: Min guard         General transfer comment: cues for hand and RLE placement with transfers x 2 trials  Ambulation/Gait Ambulation/Gait assistance: Min assist Ambulation Distance (Feet): 90 Feet Assistive device: Rolling walker (2 wheeled) Gait Pattern/deviations: Step-to pattern;Decreased stride length;Trunk flexed   Gait velocity interpretation: Below normal speed for age/gender General Gait Details: cues for sequence with decreased weight bearing on RLE and incr reliance on UEs   Stairs            Wheelchair Mobility    Modified Rankin (Stroke Patients Only)       Balance                                    Cognition Arousal/Alertness: Lethargic;Suspect due to medications (had po and IV pain meds prior) Behavior During Therapy: WFL for tasks assessed/performed Overall Cognitive Status: Within Functional Limits for tasks assessed                      Exercises Total Joint Exercises Ankle Circles/Pumps: AROM;Both;10 reps Quad Sets: AROM;Both;10 reps Heel Slides: AAROM;Right;5 reps Straight Leg Raises: AAROM;AROM;Right;5 reps Goniometric ROM:  (pt too nauseous once seated, legs elevated)    General Comments General comments (skin integrity, edema, etc.): husband present; reports may use patio entrance (see update)      Pertinent Vitals/Pain Pain Assessment: 0-10 Pain Score: 4  Pain Location: knee Pain Intervention(s): Limited activity within patient's tolerance;Monitored during session;Premedicated before session;Repositioned;Ice applied    Home Living         Home Access: Stairs to enter Entrance Stairs-Rails: None          Prior Function            PT Goals (current goals can now be found in the care plan section) Acute Rehab PT Goals Patient Stated Goal: return to water aerobics Progress towards PT goals: Progressing toward goals    Frequency  7X/week    PT Plan Current plan remains appropriate    Co-evaluation             End of Session Equipment Utilized During Treatment: Gait  belt Activity Tolerance: Treatment limited secondary to medical complications (Comment) (nauseous) Patient left: in chair;with call bell/phone within reach;with family/visitor present     Time: 7829-5621 PT Time Calculation (min) (ACUTE ONLY): 31 min  Charges:  $Gait Training: 8-22 mins $Therapeutic Exercise: 8-22 mins                    G Codes:      Amanda Warner 05/26/2014, 11:58 AM Pager [phone]

## 2014-05-20 NOTE — Progress Notes (Signed)
SPORTS MEDICINE AND JOINT REPLACEMENT  Lara Mulch, MD   Carlynn Spry, PA-C [place], [place], [place]  [id number]                             [phone]   PROGRESS NOTE  Subjective:  negative for Chest Pain  negative for Shortness of Breath  negative for Nausea/Vomiting   negative for Calf Pain  negative for Bowel Movement   Tolerating Diet: yes         Patient reports pain as 6 on 0-10 scale.    Objective: Vital signs in last 24 hours:   Patient Vitals for the past 24 hrs:  BP Temp Temp src Pulse Resp SpO2  05/20/14 0548 114/60 mmHg 97.8 F (36.6 C) Oral 72 - 99 %  05/20/14 0100 118/68 mmHg 98.8 F (37.1 C) Oral 72 - 100 %  05/19/14 2053 103/60 mmHg 97.6 F (36.4 C) Oral 69 - 100 %  05/19/14 1700 118/62 mmHg 98 F (36.7 C) Oral 72 15 96 %    @flow {1959:LAST@   Intake/Output from previous day:   01/04 0701 - 01/05 0700 In: 4388 [P.O.:360; I.V.:1425] Out: 100    Intake/Output this shift:   01/05 0701 - 01/05 1900 In: 480 [P.O.:480] Out: -    Intake/Output      01/04 0701 - 01/05 0700 01/05 0701 - 01/06 0700   P.O. 360 480   I.V. (mL/kg) 1425 (18.1)    Total Intake(mL/kg) 1785 (22.6) 480 (6.1)   Blood 100    Total Output 100     Net +1685 +480        Urine Occurrence 3 x 3 x      LABORATORY DATA:  Recent Labs  05/19/14 1153 05/20/14 0533  WBC 7.3 8.0  HGB 11.9* 10.6*  HCT 36.2 31.9*  PLT 121* 110*    Recent Labs  05/19/14 1153 05/20/14 0533  NA  --  142  K  --  3.6  CL  --  113*  CO2  --  22  BUN  --  19  CREATININE 1.16* 0.86  GLUCOSE  --  127*  CALCIUM  --  8.2*   Lab Results  Component Value Date   INR 0.88 05/07/2014    Examination:  General appearance: alert, cooperative and no distress Extremities: Homans sign is negative, no sign of DVT  Wound Exam: clean, dry, intact   Drainage:  Scant/small amount Serosanguinous exudate  Motor Exam: EHL and FHL Intact  Sensory Exam: Deep Peroneal  normal   Assessment:    1 Day Post-Op  Procedure(s) (LRB): RIGHT TOTAL KNEE ARTHROPLASTY (Right)  ADDITIONAL DIAGNOSIS:  Active Problems:   S/P total knee arthroplasty  Acute Blood Loss Anemia   Plan: Physical Therapy as ordered Weight Bearing as Tolerated (WBAT)  DVT Prophylaxis:  Lovenox  DISCHARGE PLAN: Home  DISCHARGE NEEDS: HHPT, CPM, Walker and 3-in-1 comode seat         Deontez Klinke 05/20/2014, 11:20 AM

## 2014-05-20 NOTE — Progress Notes (Signed)
Occupational Therapy Evaluation Patient Details Name: Amanda Warner MRN: [id number] DOB: 1935/03/22 Today's Date: 05/20/2014    History of Present Illness Pt admitted for R TKA with PMHx of L TKA 13 years ago   Clinical Impression   Eval limted due to c/o nausea and dizziness. BP WNL. Nsg notified. Began education on functional mobility for ADL with DME. Do not feel pt is safe to D/C home today. Will plan to see in am to complete education regarding shower transfer with 3 in 1 and pt management after D/C.    Follow Up Recommendations  No OT follow up;Supervision/Assistance - 24 hour (initially)    Equipment Recommendations  None recommended by OT    Recommendations for Other Services       Precautions / Restrictions Precautions Precautions: Knee Precaution Booklet Issued: No Precaution Comments: reviewed no pivoting/twisting and no pillow Restrictions Weight Bearing Restrictions: Yes RLE Weight Bearing: Weight bearing as tolerated      Mobility Bed Mobility Overal bed mobility: Needs Assistance Bed Mobility: Supine to Sit     Supine to sit: Supervision       Transfers Overall transfer level: Needs assistance Equipment used: Rolling walker (2 wheeled) Transfers: Sit to/from Stand Sit to Stand: Min guard             Balance             Standing balance-Leahy Scale: Fair                              ADL Overall ADL's : Needs assistance/impaired                         Toilet Transfer: Min guard;Ambulation;Comfort height toilet   Toileting- Clothing Manipulation and Hygiene: Supervision/safety;Sit to/from stand   Tub/ Shower Transfer: Copy Details (indicate cue type and reason): Educated husband on shower transfer techniqu and proper set up. Pt unable to complete due to being diizy and nauseated Functional mobility during ADLs: Min guard;Rolling walker General ADL Comments: limited by pain, nausea  and limited ROM at this time. Husband can assist as needed with ADL. Husband with questions regarding mobility for shower and use of equiment     Vision                     Perception     Praxis      Pertinent Vitals/Pain Pain Assessment: 0-10 Pain Score: 6  Pain Location: knee Pain Descriptors / Indicators: Aching;Constant Pain Intervention(s): Limited activity within patient's tolerance;Monitored during session;Repositioned;Ice applied     Hand Dominance Right   Extremity/Trunk Assessment Upper Extremity Assessment Upper Extremity Assessment: Overall WFL for tasks assessed   Lower Extremity Assessment Lower Extremity Assessment: Defer to PT evaluation   Cervical / Trunk Assessment Cervical / Trunk Assessment: Normal   Communication Communication Communication: No difficulties   Cognition Arousal/Alertness: Lethargic;Suspect due to medications (had po and IV pain meds prior) Behavior During Therapy: WFL for tasks assessed/performed Overall Cognitive Status: Within Functional Limits for tasks assessed                     General Comments       Exercises      Shoulder Instructions      Home Living Family/patient expects to be discharged to:: Private residence Living Arrangements: Spouse/significant other Available Help at Discharge: Family;Available 24 hours/day  Type of Home: House Home Access: Stairs to enter [employer] of Steps: 2 (patio) Entrance Stairs-Rails: None Home Layout: One level     Bathroom Shower/Tub: Tub/shower unit;Walk-in shower Shower/tub characteristics: Architectural technologist: Standard Bathroom Accessibility: Yes How Accessible: Accessible via walker ("tight fit") Home Equipment: Walker - 2 wheels;Cane - single point;Bedside commode;Shower seat;Grab bars - tub/shower          Prior Functioning/Environment Level of Independence: Independent             OT Diagnosis: Generalized weakness;Acute  pain   OT Problem List: Decreased strength;Decreased range of motion;Decreased activity tolerance;Decreased safety awareness;Decreased knowledge of use of DME or AE;Pain   OT Treatment/Interventions: Self-care/ADL training;Therapeutic activities;Patient/family education;DME and/or AE instruction    OT Goals(Current goals can be found in the care plan section) Acute Rehab OT Goals Patient Stated Goal: be able to do for myself OT Goal Formulation: With patient/family Time For Goal Achievement: May 25, 2014 Potential to Achieve Goals: Good  OT Frequency: Min 2X/week   Barriers to D/C:            Co-evaluation              End of Session Equipment Utilized During Treatment: Gait belt;Rolling walker CPM Right Knee CPM Right Knee: Off Nurse Communication: Mobility status  Activity Tolerance: Patient limited by pain;Other (comment) (limited by nausea) Patient left: in bed;with call bell/phone within reach;with family/visitor present   Time: 1415-1440 OT Time Calculation (min): 25 min Charges:  OT General Charges $OT Visit: 1 Procedure OT Evaluation $Initial OT Evaluation Tier I: 1 Procedure OT Treatments $Self Care/Home Management : 8-22 mins G-Codes:    Brion Sossamon,HILLARY 05-25-2014, 2:55 PM   [facility], OTR/L  [phone] May 25, 2014

## 2014-05-21 LAB — GLUCOSE, CAPILLARY
Glucose-Capillary: 138 mg/dL — ABNORMAL HIGH (ref 70–99)
Glucose-Capillary: 202 mg/dL — ABNORMAL HIGH (ref 70–99)
Glucose-Capillary: 145 mg/dL — ABNORMAL HIGH (ref 70–99)
Glucose-Capillary: 135 mg/dL — ABNORMAL HIGH (ref 70–99)

## 2014-05-21 LAB — CBC
HEMATOCRIT: 30.2 % — AB (ref 36.0–46.0)
Hemoglobin: 9.8 g/dL — ABNORMAL LOW (ref 12.0–15.0)
MCH: 29.7 pg (ref 26.0–34.0)
MCHC: 32.5 g/dL (ref 30.0–36.0)
MCV: 91.5 fL (ref 78.0–100.0)
Platelets: 125 10*3/uL — ABNORMAL LOW (ref 150–400)
RBC: 3.3 MIL/uL — ABNORMAL LOW (ref 3.87–5.11)
RDW: 13.5 % (ref 11.5–15.5)
WBC: 9.4 10*3/uL (ref 4.0–10.5)

## 2014-05-21 LAB — HEMOGLOBIN A1C
Hgb A1c MFr Bld: 6.4 % — ABNORMAL HIGH (ref <5.7)
Mean Plasma Glucose: 137 mg/dL — ABNORMAL HIGH (ref <117)

## 2014-05-21 MED ORDER — METHOCARBAMOL 500 MG PO TABS
1000.0000 mg | ORAL_TABLET | Freq: Four times a day (QID) | ORAL | Status: DC | PRN
  Administered 2014-05-21 – 2014-05-22 (×4): 1000 mg via ORAL
  Filled 2014-05-21 (×4): qty 2

## 2014-05-21 MED ORDER — METHOCARBAMOL 1000 MG/10ML IJ SOLN: 500.0000 mg | Freq: Four times a day (QID) | INTRAVENOUS | Status: DC | PRN

## 2014-05-21 MED ORDER — ENOXAPARIN SODIUM 40 MG/0.4ML ~~LOC~~ SOLN: 40.0000 mg | SUBCUTANEOUS | Status: DC

## 2014-05-21 MED ORDER — HYDROMORPHONE HCL 4 MG PO TABS: 4.0000 mg | ORAL_TABLET | ORAL | Status: DC | PRN

## 2014-05-21 MED ORDER — METHOCARBAMOL 500 MG PO TABS: 500.0000 mg | ORAL_TABLET | Freq: Four times a day (QID) | ORAL | Status: DC | PRN

## 2014-05-21 MED ORDER — OXYCODONE HCL 5 MG PO TABS: 5.0000 mg | ORAL_TABLET | ORAL | Status: DC | PRN

## 2014-05-21 MED ORDER — METOCLOPRAMIDE HCL 5 MG PO TABS: 5.0000 mg | ORAL_TABLET | Freq: Four times a day (QID) | ORAL | Status: DC | PRN

## 2014-05-21 NOTE — Progress Notes (Signed)
Occupational Therapy Treatment Patient Details Name: Amanda Warner MRN: [id number] DOB: 1935-01-26 Today's Date: 05/21/2014    History of present illness Pt admitted for R TKA with PMHx of L TKA 13 years ago   OT comments  Completed education for ADL/ mobility for ADL with pt/husband. Husband able to return demonstrate simulated shower transfer with pt. Goals met. OT signing off.   Follow Up Recommendations  No OT follow up;Supervision/Assistance - 24 hour    Equipment Recommendations    none   Recommendations for Other Services      Precautions / Restrictions Precautions Precautions: Knee Precaution Comments: reviewed importance of not putting pillow under knee  Restrictions Weight Bearing Restrictions: Yes RLE Weight Bearing: Weight bearing as tolerated          Transfers Overall transfer level: Needs assistance Equipment used: Rolling walker (2 wheeled) Transfers: Sit to/from [employer] Transfers Sit to Stand: Supervision Stand pivot transfers: Supervision       General transfer comment: min guard to block RW cues for sequencing    Balance Overall balance assessment: Needs assistance Sitting-balance support: Feet supported Sitting balance-Leahy Scale: Good     Standing balance support: During functional activity Standing balance-Leahy Scale: Poor Standing balance comment: RW to balance                    ADL                                   Tub/ Shower Transfer: Supervision/safety;Cueing for sequencing   Functional mobility during ADLs: Supervision/safety;Rolling walker General ADL Comments: Husband educated on shower transfer technique and able to reurn demonstrate                                      Cognition   Behavior During Therapy: WFL for tasks assessed/performed Overall Cognitive Status: Within Functional Limits for tasks assessed                                                        Pertinent Vitals/ Pain       Pain Assessment: 0-10 Pain Score: 8  Pain Location: R knee Pain Descriptors / Indicators: Aching;Burning;Constant Pain Intervention(s): Limited activity within patient's tolerance;Monitored during session;Repositioned;Ice applied    Home Living                                              Progress Toward Goals  OT Goals(current goals can now be found in the care plan section)   Goals met  Acute Rehab OT Goals Patient Stated Goal: to go home when the pain is better OT Goal Formulation: With patient/family Time For Goal Achievement: 05/21/14 Potential to Achieve Goals: Good ADL Goals Pt Will Transfer to Toilet: with supervision;bedside commode;ambulating Pt Will Perform Tub/Shower Transfer: with supervision;3 in 1;rolling walker;ambulating;with caregiver independent in assisting  Plan All goals met and education completed, patient discharged from OT services    Co-evaluation  End of Session Equipment Utilized During Treatment: Gait belt;Rolling walker   Activity Tolerance Patient limited by pain   Patient Left in chair;with call bell/phone within reach;with family/visitor present   Nurse Communication Mobility status;Other (comment) (request for robaxin)        Time: 3300-7622 OT Time Calculation (min): 17 min  Charges: OT General Charges $OT Visit: 1 Procedure OT Treatments $Self Care/Home Management : 8-22 mins  Ercia Crisafulli,HILLARY 05/21/2014, 3:44 PM   [facility], OTR/L  [phone] 05/21/2014

## 2014-05-21 NOTE — Progress Notes (Signed)
OT Cancellation Note  Patient Details Name: Amanda Warner MRN: [id number] DOB: 04-02-1935   Cancelled Treatment:    Reason Eval/Treat Not Completed: Other (comment) Pt eating lunch. Will return. [place], OTR/L  [phone] 05/21/2014 05/21/2014, 12:49 PM

## 2014-05-21 NOTE — Progress Notes (Signed)
Physical Therapy Treatment Patient Details Name: Amanda Warner MRN: [id number] DOB: 07/19/1934 Today's Date: 05/21/2014    History of Present Illness Pt admitted for R TKA with PMHx of L TKA 13 years ago    PT Comments    Pt greatly limited by pain and very tearful throughout session. Will plan to see this afternoon to progress therapy. Pt not safe to manage at home at this time.   Follow Up Recommendations  Home health PT;Supervision/Assistance - 24 hour     Equipment Recommendations  None recommended by PT    Recommendations for Other Services       Precautions / Restrictions Precautions Precautions: Knee Precaution Comments: reviewed no pillow or ice pack under knee Restrictions Weight Bearing Restrictions: Yes RLE Weight Bearing: Weight bearing as tolerated    Mobility  Bed Mobility Overal bed mobility: Needs Assistance Bed Mobility: Supine to Sit;Sit to Supine     Supine to sit: Supervision;HOB elevated Sit to supine: Min assist   General bed mobility comments: pt using Lt LE to (A) Rt LE off EOB; pt relying heavily on handrails; requiredm in (A) to advance Rt LE back into supine position  Transfers Overall transfer level: Needs assistance Equipment used: Rolling walker (2 wheeled) Transfers: Sit to/from Stand Sit to Stand: Min guard         General transfer comment: min guard to steady; cues for technique   Ambulation/Gait Ambulation/Gait assistance: Min guard Ambulation Distance (Feet): 80 Feet Assistive device: Rolling walker (2 wheeled) Gait Pattern/deviations: Step-to pattern;Decreased stance time - right;Decreased step length - left;Antalgic Gait velocity: decr Gait velocity interpretation: Below normal speed for age/gender General Gait Details: pt with difficulty achieving flat foot on Rt LE due to pain; cues for upright posutre and to relax shoulders; pt relying heavily on UE support to ambulate; limited by pain and fatigue    Stairs             Wheelchair Mobility    Modified Rankin (Stroke Patients Only)       Balance Overall balance assessment: Needs assistance Sitting-balance support: Feet supported;No upper extremity supported Sitting balance-Leahy Scale: Good     Standing balance support: During functional activity;Bilateral upper extremity supported Standing balance-Leahy Scale: Poor                      Cognition Arousal/Alertness: Awake/alert Behavior During Therapy: WFL for tasks assessed/performed Overall Cognitive Status: Within Functional Limits for tasks assessed                      Exercises Total Joint Exercises Ankle Circles/Pumps: AROM;Both;10 reps Heel Slides: AAROM;Right;5 reps;Limitations Heel Slides Limitations: pain Hip ABduction/ADduction: AAROM;Strengthening;Right;10 reps;Seated Long Arc Quad: AAROM;Right;5 reps;Limitations Long Arc Quad Limitations: pain Goniometric ROM: -5 to 45 degrees     General Comments General comments (skin integrity, edema, etc.): pt very frustrated and tearful due to pain      Pertinent Vitals/Pain Pain Assessment: 0-10 Pain Score: 10-Worst pain ever Pain Location: Rt knee Pain Descriptors / Indicators: Burning Pain Intervention(s): Limited activity within patient's tolerance;Monitored during session;RN gave pain meds during session;Ice applied;Repositioned    Home Living                      Prior Function            PT Goals (current goals can now be found in the care plan section) Acute Rehab PT Goals Patient Stated Goal: to  not be in so much pain PT Goal Formulation: With patient Time For Goal Achievement: 05/26/14 Potential to Achieve Goals: Good Progress towards PT goals: Progressing toward goals    Frequency  7X/week    PT Plan Current plan remains appropriate    Co-evaluation             End of Session Equipment Utilized During Treatment: Gait belt Activity Tolerance: Patient tolerated  treatment well Patient left: in bed;with call bell/phone within reach;with family/visitor present     Time: 0753-0824 PT Time Calculation (min) (ACUTE ONLY): 31 min  Charges:  $Gait Training: 8-22 mins $Therapeutic Exercise: 8-22 mins                    G CodesGustavus Bryant, [place]  [phone] 05/21/2014, 8:34 AM

## 2014-05-21 NOTE — Progress Notes (Signed)
Orthopedic Tech Progress Note Patient Details:  Amanda Warner 18-Nov-1934 [phone] On cpm at 7:00 pm Patient ID: Amanda Warner, female   DOB: 1935/01/14, 80 y.o.   MRN: [id number]   Amanda Warner 05/21/2014, 6:57 PM

## 2014-05-21 NOTE — Progress Notes (Signed)
Physical Therapy Treatment Patient Details Name: Amanda Warner MRN: [id number] DOB: April 07, 1935 Today's Date: 05/21/2014    History of Present Illness Pt admitted for R TKA with PMHx of L TKA 13 years ago    PT Comments    Pt continues to progress slowly due to difficulty with pain management. Was able to practice stair ambulation this afternoon with husband present. Pt uncertain if she will D/C home today or tomorrow due to pain. Will follow per POC.   Follow Up Recommendations  Home health PT;Supervision/Assistance - 24 hour     Equipment Recommendations  None recommended by PT    Recommendations for Other Services       Precautions / Restrictions Precautions Precautions: Knee Precaution Comments: reviewed importance of not putting pillow under knee  Restrictions Weight Bearing Restrictions: Yes RLE Weight Bearing: Weight bearing as tolerated    Mobility  Bed Mobility Overal bed mobility: Needs Assistance Bed Mobility: Supine to Sit     Supine to sit: Supervision     General bed mobility comments: bed flattened to simulate home, pt requiring incr time and min cues for sequencing   Transfers Overall transfer level: Needs assistance Equipment used: Rolling walker (2 wheeled) Transfers: Sit to/from Stand Sit to Stand: Min guard         General transfer comment: min guard to block RW cues for sequencing  Ambulation/Gait Ambulation/Gait assistance: Min guard Ambulation Distance (Feet): 100 Feet Assistive device: Rolling walker (2 wheeled) Gait Pattern/deviations: Step-through pattern;Decreased step length - left;Decreased stance time - right;Antalgic Gait velocity: decr Gait velocity interpretation: Below normal speed for age/gender General Gait Details: cues for upright posture and step through gt; pt requiring min guard to steady and manage RW   Stairs Stairs: Yes Stairs assistance: Min guard Stair Management: No rails;Step to pattern;Forwards;With  walker;Backwards Number of Stairs: 2 General stair comments: educated pt and husband on technique   Wheelchair Mobility    Modified Rankin (Stroke Patients Only)       Balance Overall balance assessment: Needs assistance Sitting-balance support: Feet supported;No upper extremity supported Sitting balance-Leahy Scale: Good     Standing balance support: During functional activity;Bilateral upper extremity supported Standing balance-Leahy Scale: Poor Standing balance comment: RW to balance                     Cognition Arousal/Alertness: Awake/alert Behavior During Therapy: WFL for tasks assessed/performed Overall Cognitive Status: Within Functional Limits for tasks assessed                      Exercises Total Joint Exercises Ankle Circles/Pumps: AROM;Both;10 reps Quad Sets: AROM;Both;10 reps Heel Slides: AAROM;Right;10 reps;Seated;Limitations (2 sets of 5) Heel Slides Limitations: pain Hip ABduction/ADduction: AAROM;Strengthening;Right;10 reps;Seated Knee Flexion: AAROM;Right;5 reps;Seated    General Comments General comments (skin integrity, edema, etc.): reviewed car transfer technique and DME for home safety      Pertinent Vitals/Pain Pain Assessment: 0-10 Pain Score: 8  Pain Location: Rt knee Pain Descriptors / Indicators: Aching;Burning Pain Intervention(s): Monitored during session;Premedicated before session;Repositioned;Patient requesting pain meds-RN notified;Ice applied    Home Living                      Prior Function            PT Goals (current goals can now be found in the care plan section) Acute Rehab PT Goals Patient Stated Goal: to go home when the pain is better PT Goal  Formulation: With patient Time For Goal Achievement: 05/26/14 Potential to Achieve Goals: Good Progress towards PT goals: Progressing toward goals    Frequency  7X/week    PT Plan Current plan remains appropriate    Co-evaluation              End of Session Equipment Utilized During Treatment: Gait belt Activity Tolerance: Patient tolerated treatment well Patient left: in chair;with call bell/phone within reach;with family/visitor present     Time: 7209-4709 PT Time Calculation (min) (ACUTE ONLY): 35 min  Charges:  $Gait Training: 8-22 mins $Therapeutic Exercise: 8-22 mins                    G CodesGustavus Bryant , [place]  [phone]  05/21/2014, 3:43 PM

## 2014-05-22 LAB — CBC
HEMATOCRIT: 24.6 % — AB (ref 36.0–46.0)
Hemoglobin: 8.4 g/dL — ABNORMAL LOW (ref 12.0–15.0)
MCH: 31.5 pg (ref 26.0–34.0)
MCHC: 34.1 g/dL (ref 30.0–36.0)
MCV: 92.1 fL (ref 78.0–100.0)
Platelets: 103 10*3/uL — ABNORMAL LOW (ref 150–400)
RBC: 2.67 MIL/uL — ABNORMAL LOW (ref 3.87–5.11)
RDW: 13.2 % (ref 11.5–15.5)
WBC: 6.7 10*3/uL (ref 4.0–10.5)

## 2014-05-22 LAB — GLUCOSE, CAPILLARY
Glucose-Capillary: 137 mg/dL — ABNORMAL HIGH (ref 70–99)
Glucose-Capillary: 116 mg/dL — ABNORMAL HIGH (ref 70–99)

## 2014-05-22 NOTE — Op Note (Signed)
TOTAL KNEE REPLACEMENT OPERATIVE NOTE:  05/19/2014  8:26 AM  PATIENT:  Amanda Warner  80 y.o. female  PRE-OPERATIVE DIAGNOSIS:  osteoarthritis right knee  POST-OPERATIVE DIAGNOSIS:  osteoarthritis right knee  PROCEDURE:  Procedure(s): RIGHT TOTAL KNEE ARTHROPLASTY  SURGEON:  Surgeon(s): Vickey Huger, MD  PHYSICIAN ASSISTANT: Carlynn Spry, [facility]  ANESTHESIA:   general  DRAINS: Hemovac  SPECIMEN: None  COUNTS:  Correct  TOURNIQUET:   Total Tourniquet Time Documented: Thigh (Right) - 51 minutes Total: Thigh (Right) - 51 minutes   DICTATION:  Indication for procedure:    The patient is a 80 y.o. female who has failed conservative treatment for osteoarthritis right knee.  Informed consent was obtained prior to anesthesia. The risks versus benefits of the operation were explain and in a way the patient can, and did, understand.   On the implant demand matching protocol, this patient scored 8.  Therefore, this patient was not receive a polyethylene insert with vitamin E which is a high demand implant.  Description of procedure:     The patient was taken to the operating room and placed under anesthesia.  The patient was positioned in the usual fashion taking care that all body parts were adequately padded and/or protected.  I foley catheter was not placed.  A tourniquet was applied and the leg prepped and draped in the usual sterile fashion.  The extremity was exsanguinated with the esmarch and tourniquet inflated to 350 mmHg.  Pre-operative range of motion was normal.  The knee was in 5 degree of mild varus.  A midline incision approximately 6-7 inches long was made with a #10 blade.  A new blade was used to make a parapatellar arthrotomy going 2-3 cm into the quadriceps tendon, over the patella, and alongside the medial aspect of the patellar tendon.  A synovectomy was then performed with the #10 blade and forceps. I then elevated the deep MCL off the medial tibial metaphysis  subperiosteally around to the semimembranosus attachment.    I everted the patella and used calipers to measure patellar thickness.  I used the reamer to ream down to appropriate thickness to recreate the native thickness.  I then removed excess bone with the rongeur and sagittal saw.  I used the appropriately sized template and drilled the three lug holes.  I then put the trial in place and measured the thickness with the calipers to ensure recreation of the native thickness.  The trial was then removed and the patella subluxed and the knee brought into flexion.  A homan retractor was place to retract and protect the patella and lateral structures.  A Z-retractor was place medially to protect the medial structures.  The extra-medullary alignment system was used to make cut the tibial articular surface perpendicular to the anamotic axis of the tibia and in 3 degrees of posterior slope.  The cut surface and alignment jig was removed.  I then used the intramedullary alignment guide to make a 6 valgus cut on the distal femur.  I then marked out the epicondylar axis on the distal femur.  The posterior condylar axis measured 4 degrees.  I then used the anterior referencing sizer and measured the femur to be a size 7.  The 4-In-1 cutting block was screwed into place in external rotation matching the posterior condylar angle, making our cuts perpendicular to the epicondylar axis.  Anterior, posterior and chamfer cuts were made with the sagittal saw.  The cutting block and cut pieces were removed.  A  lamina spreader was placed in 90 degrees of flexion.  The ACL, PCL, menisci, and posterior condylar osteophytes were removed.  A 12 mm spacer blocked was found to offer good flexion and extension gap balance after minimal in degree releasing.   The scoop retractor was then placed and the femoral finishing block was pinned in place.  The small sagittal saw was used as well as the lug drill to finish the femur.  The block  and cut surfaces were removed and the medullary canal hole filled with autograft bone from the cut pieces.  The tibia was delivered forward in deep flexion and external rotation.  A size E tray was selected and pinned into place centered on the medial 1/3 of the tibial tubercle.  The reamer and keel was used to prepare the tibia through the tray.    I then trialed with the size 7 femur, size E tibia, a 12 mm insert and the 32 patella.  I had excellent flexion/extension gap balance, excellent patella tracking.  Flexion was full and beyond 120 degrees; extension was zero.  These components were chosen and the staff opened them to me on the back table while the knee was lavaged copiously and the cement mixed.  The soft tissue was infiltrated with 60cc of exparel 1.3% through a 21 gauge needle.  I cemented in the components and removed all excess cement.  The polyethylene tibial component was snapped into place and the knee placed in extension while cement was hardening.  The capsule was infilltrated with 30cc of .25% Marcaine with epinephrine.  A hemovac was place in the joint exiting superolaterally.  A pain pump was place superomedially superficial to the arthrotomy.  Once the cement was hard, the tourniquet was let down.  Hemostasis was obtained.  The arthrotomy was closed with figure-8 #1 vicryl sutures.  The deep soft tissues were closed with #0 vicryls and the subcuticular layer closed with a running #2-0 vicryl.  The skin was reapproximated and closed with skin staples.  The wound was dressed with xeroform, 4 x4's, 2 ABD sponges, a single layer of webril and a TED stocking.   The patient was then awakened, extubated, and taken to the recovery room in stable condition.  BLOOD LOSS:  300cc DRAINS: 1 hemovac, 1 pain catheter COMPLICATIONS:  None.  PLAN OF CARE: Admit to inpatient   PATIENT DISPOSITION:  PACU - hemodynamically stable.   Delay start of Pharmacological VTE agent (>24hrs) due to  surgical blood loss or risk of bleeding:  not applicable  Please fax a copy of this op note to my office at [phone] (please only include page 1 and 2 of the Case Information op note)

## 2014-05-22 NOTE — Progress Notes (Signed)
SPORTS MEDICINE AND JOINT REPLACEMENT  Lara Mulch, MD   Carlynn Spry, PA-C [place], [place], [place]  [address]                             [phone]   PROGRESS NOTE  Subjective:  negative for Chest Pain  negative for Shortness of Breath  negative for Nausea/Vomiting   negative for Calf Pain  negative for Bowel Movement   Tolerating Diet: yes         Patient reports pain as 6 on 0-10 scale.    Objective: Vital signs in last 24 hours:   Patient Vitals for the past 24 hrs:  BP Temp Temp src Pulse Resp SpO2  05/22/14 1156 (!) 137/59 mmHg - - 85 16 95 %  05/22/14 0800 - - - - 16 91 %  05/22/14 0539 117/61 mmHg 99 F (37.2 C) Oral 95 16 91 %  05/22/14 0400 - - - - 17 -  05/22/14 0000 - - - - 16 -  05/21/14 2101 138/61 mmHg 97.8 F (36.6 C) Oral 94 17 99 %  05/21/14 2000 - - - - 17 -  05/21/14 1527 118/62 mmHg 98.2 F (36.8 C) Oral 87 16 92 %    @flow {1959:LAST@   Intake/Output from previous day:   01/06 0701 - 01/07 0700 In: 1080 [P.O.:1080] Out: -    Intake/Output this shift:       Intake/Output      01/06 0701 - 01/07 0700 01/07 0701 - 01/08 0700   P.O. 1080    I.V. (mL/kg)     Total Intake(mL/kg) 1080 (13.7)    Net +1080          Urine Occurrence 6 x 1 x      LABORATORY DATA:  Recent Labs  05/19/14 1153 05/20/14 0533 05/21/14 0625 05/22/14 0845  WBC 7.3 8.0 9.4 6.7  HGB 11.9* 10.6* 9.8* 8.4*  HCT 36.2 31.9* 30.2* 24.6*  PLT 121* 110* 125* 103*    Recent Labs  05/19/14 1153 05/20/14 0533  NA  --  142  K  --  3.6  CL  --  113*  CO2  --  22  BUN  --  19  CREATININE 1.16* 0.86  GLUCOSE  --  127*  CALCIUM  --  8.2*   Lab Results  Component Value Date   INR 0.88 05/07/2014    Examination:  General appearance: alert, cooperative and no distress Extremities: Homans sign is negative, no sign of DVT  Wound Exam: clean, dry, intact   Drainage:  None: wound tissue dry  Motor Exam: EHL and FHL Intact  Sensory Exam:  Deep Peroneal normal   Assessment:    3 Days Post-Op  Procedure(s) (LRB): RIGHT TOTAL KNEE ARTHROPLASTY (Right)  ADDITIONAL DIAGNOSIS:  Active Problems:   S/P total knee arthroplasty  Acute Blood Loss Anemia   Plan: Physical Therapy as ordered Weight Bearing as Tolerated (WBAT)  DVT Prophylaxis:  Lovenox  DISCHARGE PLAN: Home  DISCHARGE NEEDS: HHPT, CPM, Walker and 3-in-1 comode seat         Matilda Fleig 05/22/2014, 12:58 PM

## 2014-05-22 NOTE — Progress Notes (Signed)
Medicare Important Message given? YES  (If response is "NO", the following Medicare IM given date fields will be blank)  Date Medicare IM given:  05/22/2014 Medicare IM given by: Tenasia Aull  

## 2014-05-22 NOTE — Progress Notes (Signed)
Physical Therapy Treatment Patient Details Name: Amanda Warner MRN: [id number] DOB: 1935-01-31 Today's Date: 05/22/2014    History of Present Illness Pt admitted for R TKA with PMHx of L TKA 13 years ago    PT Comments    Pt moving well despite pain.  Continued education on stairs today.  Probable d/c home with HHPT.  Discussed home plans with pt and husband.  Follow Up Recommendations  Home health PT     Equipment Recommendations  None recommended by PT    Recommendations for Other Services       Precautions / Restrictions Precautions Precautions: Knee Precaution Comments: reviewed importance of not putting pillow under knee  Restrictions Weight Bearing Restrictions: Yes RLE Weight Bearing: Weight bearing as tolerated    Mobility  Bed Mobility               General bed mobility comments: pt up in bed upon arrival.  Transfers Overall transfer level: Needs assistance Equipment used: Rolling walker (2 wheeled) Transfers: Sit to/from Stand Sit to Stand: Supervision         General transfer comment: stood from recliner and toilet.  husband assisting as needed  Ambulation/Gait Ambulation/Gait assistance: Min guard Ambulation Distance (Feet): 35 Feet (in room) Assistive device: Rolling walker (2 wheeled) Gait Pattern/deviations: Step-through pattern;Antalgic;Decreased stance time - right     General Gait Details: Cues for safety with turning and to take smaller steps with turns.   Stairs   Stairs assistance: Min guard Stair Management: Backwards;With walker Number of Stairs: 1 General stair comments: Pt practiced going up curb as she has 1 step then a sidewalk then another step per husband.  Educated on safety and techniques.  Wheelchair Mobility    Modified Rankin (Stroke Patients Only)       Balance     Sitting balance-Leahy Scale: Good       Standing balance-Leahy Scale: Poor                      Cognition Arousal/Alertness:  Awake/alert Behavior During Therapy: WFL for tasks assessed/performed Overall Cognitive Status: Within Functional Limits for tasks assessed                      Exercises Total Joint Exercises Ankle Circles/Pumps: 20 reps;AROM;Both Quad Sets: AROM;Both;10 reps Heel Slides: AAROM;10 reps;Both    General Comments General comments (skin integrity, edema, etc.): Pt very concerned about swelling in R LE.  Husband is very supportive and discussed d/c exercise program and safe mobility with he and patient.      Pertinent Vitals/Pain Pain Assessment: Faces Faces Pain Scale: Hurts whole lot Pain Location: R knee Pain Intervention(s): Limited activity within patient's tolerance;Monitored during session;Repositioned    Home Living                      Prior Function            PT Goals (current goals can now be found in the care plan section) Acute Rehab PT Goals Patient Stated Goal: to go home when the pain is better PT Goal Formulation: With patient Time For Goal Achievement: 05/26/14 Potential to Achieve Goals: Good Progress towards PT goals: Progressing toward goals    Frequency  7X/week    PT Plan Current plan remains appropriate    Co-evaluation             End of Session Equipment Utilized During Treatment: Gait belt  Activity Tolerance: Patient tolerated treatment well;Patient limited by pain Patient left: in chair;with call bell/phone within reach;with family/visitor present     Time: 8616-8372 PT Time Calculation (min) (ACUTE ONLY): 28 min  Charges:  $Gait Training: 8-22 mins $Therapeutic Exercise: 8-22 mins                    G Codes:      Moriah Shawley LUBECK 05/22/2014, 12:35 PM

## 2014-05-22 NOTE — Discharge Instructions (Signed)
Diet: As you were doing prior to hospitalization   Activity:  Increase activity slowly as tolerated                  No lifting or driving for 6 weeks  Shower:  May shower without a dressing once there is no drainage from your wound.                 Do NOT wash over the wound.                 Dressing:  You may change your dressing on Thursday                    Then change the dressing daily with sterile 4"x4"s gauze dressing                     And TED hose for knees.  Weight Bearing:  Weight bearing as tolerated as taught in physical therapy.  Use a                                walker or Crutches as instructed.  To prevent constipation: you may use a stool softener such as -               Colace ( over the counter) 100 mg by mouth twice a day                Drink plenty of fluids ( prune juice may be helpful) and high fiber foods                Miralax ( over the counter) for constipation as needed.    Precautions:  If you experience chest pain or shortness of breath - call 911 immediately               For transfer to the hospital emergency department!!               If you develop a fever greater that 101 F, purulent drainage from wound,                             increased redness or drainage from wound, or calf pain -- Call the office.  Follow- Up Appointment:  Please call for an appointment to be seen on 06/03/14                                              [facility] office:  [phone]            [address] [place], [place] [address]

## 2014-05-23 DIAGNOSIS — Z86718 Personal history of other venous thrombosis and embolism: Secondary | ICD-10-CM | POA: Diagnosis not present

## 2014-05-23 DIAGNOSIS — E119 Type 2 diabetes mellitus without complications: Secondary | ICD-10-CM | POA: Diagnosis not present

## 2014-05-23 DIAGNOSIS — Z471 Aftercare following joint replacement surgery: Secondary | ICD-10-CM | POA: Diagnosis not present

## 2014-05-23 DIAGNOSIS — Z96653 Presence of artificial knee joint, bilateral: Secondary | ICD-10-CM | POA: Diagnosis not present

## 2014-05-23 DIAGNOSIS — Z9181 History of falling: Secondary | ICD-10-CM | POA: Diagnosis not present

## 2014-05-23 DIAGNOSIS — Z87891 Personal history of nicotine dependence: Secondary | ICD-10-CM | POA: Diagnosis not present

## 2014-05-24 DIAGNOSIS — Z96653 Presence of artificial knee joint, bilateral: Secondary | ICD-10-CM | POA: Diagnosis not present

## 2014-05-24 DIAGNOSIS — Z9181 History of falling: Secondary | ICD-10-CM | POA: Diagnosis not present

## 2014-05-24 DIAGNOSIS — Z86718 Personal history of other venous thrombosis and embolism: Secondary | ICD-10-CM | POA: Diagnosis not present

## 2014-05-24 DIAGNOSIS — E119 Type 2 diabetes mellitus without complications: Secondary | ICD-10-CM | POA: Diagnosis not present

## 2014-05-24 DIAGNOSIS — Z471 Aftercare following joint replacement surgery: Secondary | ICD-10-CM | POA: Diagnosis not present

## 2014-05-24 DIAGNOSIS — Z87891 Personal history of nicotine dependence: Secondary | ICD-10-CM | POA: Diagnosis not present

## 2014-05-26 DIAGNOSIS — Z471 Aftercare following joint replacement surgery: Secondary | ICD-10-CM | POA: Diagnosis not present

## 2014-05-26 DIAGNOSIS — Z86718 Personal history of other venous thrombosis and embolism: Secondary | ICD-10-CM | POA: Diagnosis not present

## 2014-05-26 DIAGNOSIS — E119 Type 2 diabetes mellitus without complications: Secondary | ICD-10-CM | POA: Diagnosis not present

## 2014-05-26 DIAGNOSIS — Z9181 History of falling: Secondary | ICD-10-CM | POA: Diagnosis not present

## 2014-05-26 DIAGNOSIS — Z96653 Presence of artificial knee joint, bilateral: Secondary | ICD-10-CM | POA: Diagnosis not present

## 2014-05-26 DIAGNOSIS — Z87891 Personal history of nicotine dependence: Secondary | ICD-10-CM | POA: Diagnosis not present

## 2014-05-27 DIAGNOSIS — Z87891 Personal history of nicotine dependence: Secondary | ICD-10-CM | POA: Diagnosis not present

## 2014-05-27 DIAGNOSIS — Z96653 Presence of artificial knee joint, bilateral: Secondary | ICD-10-CM | POA: Diagnosis not present

## 2014-05-27 DIAGNOSIS — Z86718 Personal history of other venous thrombosis and embolism: Secondary | ICD-10-CM | POA: Diagnosis not present

## 2014-05-27 DIAGNOSIS — Z9181 History of falling: Secondary | ICD-10-CM | POA: Diagnosis not present

## 2014-05-27 DIAGNOSIS — Z471 Aftercare following joint replacement surgery: Secondary | ICD-10-CM | POA: Diagnosis not present

## 2014-05-27 DIAGNOSIS — E119 Type 2 diabetes mellitus without complications: Secondary | ICD-10-CM | POA: Diagnosis not present

## 2014-05-27 NOTE — Discharge Summary (Signed)
SPORTS MEDICINE & JOINT REPLACEMENT   Lara Mulch, MD   Carlynn Spry, PA-C [place], [place], [place]  [address]                             [phone]  PATIENT ID: Amanda Warner        MRN:  [id number]          DOB/AGE: February 28, 1935 / 80 y.o.    DISCHARGE SUMMARY  ADMISSION DATE:    05/19/2014 DISCHARGE DATE:  05/22/2014  ADMISSION DIAGNOSIS: osteoarthritis right knee    DISCHARGE DIAGNOSIS:  osteoarthritis right knee    ADDITIONAL DIAGNOSIS: Active Problems:   S/P total knee arthroplasty  Past Medical History  Diagnosis Date  . Arthritis   . Diabetes mellitus without complication   . Hypothyroidism   . GERD (gastroesophageal reflux disease)   . Hx of blood clots     lt leg  . IBS (irritable bowel syndrome)   . Complication of anesthesia   . PONV (postoperative nausea and vomiting)     PROCEDURE: Procedure(s): RIGHT TOTAL KNEE ARTHROPLASTY on 05/19/2014  CONSULTS:     HISTORY:  See H&P in chart  HOSPITAL COURSE:  Othel Basara is a 80 y.o. admitted on 05/19/2014 and found to have a diagnosis of osteoarthritis right knee.  After appropriate laboratory studies were obtained  they were taken to the operating room on 05/19/2014 and underwent Procedure(s): RIGHT TOTAL KNEE ARTHROPLASTY.   They were given perioperative antibiotics:  Anti-infectives    Start     Dose/Rate Route Frequency Ordered Stop   05/19/14 1930  vancomycin (VANCOCIN) IVPB 1000 mg/200 mL premix     1,000 mg200 mL/hr over 60 Minutes Intravenous Every 12 hours 05/19/14 1108 05/19/14 1935   05/19/14 0600  vancomycin (VANCOCIN) IVPB 1000 mg/200 mL premix     1,000 mg200 mL/hr over 60 Minutes Intravenous On call 05/18/14 1246 05/19/14 0720    .  Tolerated the procedure well.  Placed with a foley intraoperatively.  Given Ofirmev at induction and for 48 hours.    POD# 1: Vital signs were stable.  Patient denied Chest pain, shortness of breath, or calf pain.  Patient was started on Lovenox 30 mg  subcutaneously twice daily at 8am.  Consults to PT, OT, and care management were made.  The patient was weight bearing as tolerated.  CPM was placed on the operative leg 0-90 degrees for 6-8 hours a day.  Incentive spirometry was taught.  Dressing was changed.  Hemovac was discontinued.      POD #2, Continued  PT for ambulation and exercise program.  IV saline locked.  O2 discontinued.    The remainder of the hospital course was dedicated to ambulation and strengthening.   The patient was discharged on 3 days post op in  Good condition.  Blood products given:none  DIAGNOSTIC STUDIES: Recent vital signs: No data found.      Recent laboratory studies:  Recent Labs  05/21/14 0625 05/22/14 0845  WBC 9.4 6.7  HGB 9.8* 8.4*  HCT 30.2* 24.6*  PLT 125* 103*   No results for input(s): NA, K, CL, CO2, BUN, CREATININE, GLUCOSE, CALCIUM in the last 168 hours. Lab Results  Component Value Date   INR 0.88 05/07/2014     Recent Radiographic Studies :  Dg Chest 2 View  05/07/2014   CLINICAL DATA:  Preoperative evaluation for knee replacement  EXAM: CHEST  2  VIEW  COMPARISON:  08/29/2013  FINDINGS: Cardiac shadow is within normal limits. The lungs are well aerated bilaterally. No focal infiltrate or sizable effusion is seen. No acute bony abnormality is noted.  IMPRESSION: No acute abnormality seen.   Electronically Signed   By: Inez Catalina M.D.   On: 05/07/2014 12:12    DISCHARGE INSTRUCTIONS: Discharge Instructions    CPM    Complete by:  As directed   Continuous passive motion machine (CPM):      Use the CPM from 0 to 90 for 6-8 hours per day.      You may increase by 10 per day.  You may break it up into 2 or 3 sessions per day.      Use CPM for 2 weeks or until you are told to stop.     Call MD / Call 911    Complete by:  As directed   If you experience chest pain or shortness of breath, CALL 911 and be transported to the hospital emergency room.  If you develope a fever above 101  F, pus (white drainage) or increased drainage or redness at the wound, or calf pain, call your surgeon's office.     Change dressing    Complete by:  As directed   Change dressing on Thursday, then change the dressing daily with sterile 4 x 4 inch gauze dressing and apply TED hose.     Constipation Prevention    Complete by:  As directed   Drink plenty of fluids.  Prune juice may be helpful.  You may use a stool softener, such as Colace (over the counter) 100 mg twice a day.  Use MiraLax (over the counter) for constipation as needed.     Diet - low sodium heart healthy    Complete by:  As directed      Do not put a pillow under the knee. Place it under the heel.    Complete by:  As directed      Driving restrictions    Complete by:  As directed   No driving for 6 weeks     Increase activity slowly as tolerated    Complete by:  As directed      Lifting restrictions    Complete by:  As directed   No lifting for 6 weeks     TED hose    Complete by:  As directed   Use stockings (TED hose) for 3 weeks on both leg(s).  You may remove them at night for sleeping.           DISCHARGE MEDICATIONS:     Medication List    STOP taking these medications        aspirin EC 81 MG tablet      TAKE these medications        acetaminophen 500 MG tablet  Commonly known as:  TYLENOL  Take 500 mg by mouth every 6 (six) hours as needed (for pain).     atorvastatin 40 MG tablet  Commonly known as:  LIPITOR  Take 20 mg by mouth at bedtime.     enoxaparin 40 MG/0.4ML injection  Commonly known as:  LOVENOX  Inject 0.4 mLs (40 mg total) into the skin daily.     febuxostat 40 MG tablet  Commonly known as:  ULORIC  Take 40 mg by mouth every other day.     furosemide 40 MG tablet  Commonly known as:  LASIX  Take 20  mg by mouth daily.     gabapentin 100 MG capsule  Commonly known as:  NEURONTIN  Take 100 mg by mouth 2 (two) times daily as needed (for pain).     HYDROmorphone 4 MG tablet   Commonly known as:  DILAUDID  Take 1 tablet (4 mg total) by mouth every 3 (three) hours as needed for severe pain.     levothyroxine 50 MCG tablet  Commonly known as:  SYNTHROID, LEVOTHROID  Take 50 mcg by mouth daily before breakfast.     methocarbamol 500 MG tablet  Commonly known as:  ROBAXIN  Take 1-2 tablets (500-1,000 mg total) by mouth every 6 (six) hours as needed for muscle spasms.     metoCLOPramide 5 MG tablet  Commonly known as:  REGLAN  Take 1 tablet (5 mg total) by mouth every 6 (six) hours as needed for nausea (if ondansetron (ZOFRAN) ineffective.).     MIRALAX PO  Take by mouth 2 (two) times daily as needed.     potassium chloride 10 MEQ tablet  Commonly known as:  K-DUR  Take 10 mEq by mouth at bedtime.     PROBIOTIC PO  Take 1 capsule by mouth daily.     sennosides-docusate sodium 8.6-50 MG tablet  Commonly known as:  SENOKOT-S  Take 1 tablet by mouth daily as needed for constipation.     VICTOZA 18 MG/3ML Sopn  Generic drug:  Liraglutide  Inject 1.8 mLs into the skin daily.     Vitamin D (Ergocalciferol) 50000 UNITS Caps capsule  Commonly known as:  DRISDOL  Take 50,000 Units by mouth every Sunday.        FOLLOW UP VISIT:       Follow-up Information    Follow up with [facility].   Why:  Home Health Physical Therapy   Contact information:   [place] [address] [phone]       Follow up with Rudean Haskell, MD. Call on 06/03/2014.   Specialty:  Orthopedic Surgery   Contact information:   [place] [address] [phone]       DISPOSITION: HOME CONDITION:  Good   Ezeriah Luty 05/27/2014, 3:24 PM

## 2014-05-28 DIAGNOSIS — Z471 Aftercare following joint replacement surgery: Secondary | ICD-10-CM | POA: Diagnosis not present

## 2014-05-28 DIAGNOSIS — E119 Type 2 diabetes mellitus without complications: Secondary | ICD-10-CM | POA: Diagnosis not present

## 2014-05-28 DIAGNOSIS — Z9181 History of falling: Secondary | ICD-10-CM | POA: Diagnosis not present

## 2014-05-28 DIAGNOSIS — Z87891 Personal history of nicotine dependence: Secondary | ICD-10-CM | POA: Diagnosis not present

## 2014-05-28 DIAGNOSIS — Z96653 Presence of artificial knee joint, bilateral: Secondary | ICD-10-CM | POA: Diagnosis not present

## 2014-05-28 DIAGNOSIS — Z86718 Personal history of other venous thrombosis and embolism: Secondary | ICD-10-CM | POA: Diagnosis not present

## 2014-05-29 DIAGNOSIS — E119 Type 2 diabetes mellitus without complications: Secondary | ICD-10-CM | POA: Diagnosis not present

## 2014-05-29 DIAGNOSIS — Z471 Aftercare following joint replacement surgery: Secondary | ICD-10-CM | POA: Diagnosis not present

## 2014-05-29 DIAGNOSIS — Z9181 History of falling: Secondary | ICD-10-CM | POA: Diagnosis not present

## 2014-05-29 DIAGNOSIS — Z96653 Presence of artificial knee joint, bilateral: Secondary | ICD-10-CM | POA: Diagnosis not present

## 2014-05-29 DIAGNOSIS — Z87891 Personal history of nicotine dependence: Secondary | ICD-10-CM | POA: Diagnosis not present

## 2014-05-29 DIAGNOSIS — Z86718 Personal history of other venous thrombosis and embolism: Secondary | ICD-10-CM | POA: Diagnosis not present

## 2014-05-30 DIAGNOSIS — Z96653 Presence of artificial knee joint, bilateral: Secondary | ICD-10-CM | POA: Diagnosis not present

## 2014-05-30 DIAGNOSIS — Z87891 Personal history of nicotine dependence: Secondary | ICD-10-CM | POA: Diagnosis not present

## 2014-05-30 DIAGNOSIS — Z86718 Personal history of other venous thrombosis and embolism: Secondary | ICD-10-CM | POA: Diagnosis not present

## 2014-05-30 DIAGNOSIS — E119 Type 2 diabetes mellitus without complications: Secondary | ICD-10-CM | POA: Diagnosis not present

## 2014-05-30 DIAGNOSIS — Z9181 History of falling: Secondary | ICD-10-CM | POA: Diagnosis not present

## 2014-05-30 DIAGNOSIS — Z471 Aftercare following joint replacement surgery: Secondary | ICD-10-CM | POA: Diagnosis not present

## 2014-05-31 DIAGNOSIS — E119 Type 2 diabetes mellitus without complications: Secondary | ICD-10-CM | POA: Diagnosis not present

## 2014-05-31 DIAGNOSIS — Z87891 Personal history of nicotine dependence: Secondary | ICD-10-CM | POA: Diagnosis not present

## 2014-05-31 DIAGNOSIS — Z471 Aftercare following joint replacement surgery: Secondary | ICD-10-CM | POA: Diagnosis not present

## 2014-05-31 DIAGNOSIS — Z96653 Presence of artificial knee joint, bilateral: Secondary | ICD-10-CM | POA: Diagnosis not present

## 2014-05-31 DIAGNOSIS — Z86718 Personal history of other venous thrombosis and embolism: Secondary | ICD-10-CM | POA: Diagnosis not present

## 2014-05-31 DIAGNOSIS — Z9181 History of falling: Secondary | ICD-10-CM | POA: Diagnosis not present

## 2014-06-02 DIAGNOSIS — Z96653 Presence of artificial knee joint, bilateral: Secondary | ICD-10-CM | POA: Diagnosis not present

## 2014-06-02 DIAGNOSIS — Z471 Aftercare following joint replacement surgery: Secondary | ICD-10-CM | POA: Diagnosis not present

## 2014-06-02 DIAGNOSIS — Z87891 Personal history of nicotine dependence: Secondary | ICD-10-CM | POA: Diagnosis not present

## 2014-06-02 DIAGNOSIS — Z86718 Personal history of other venous thrombosis and embolism: Secondary | ICD-10-CM | POA: Diagnosis not present

## 2014-06-02 DIAGNOSIS — Z9181 History of falling: Secondary | ICD-10-CM | POA: Diagnosis not present

## 2014-06-02 DIAGNOSIS — E119 Type 2 diabetes mellitus without complications: Secondary | ICD-10-CM | POA: Diagnosis not present

## 2014-06-03 DIAGNOSIS — Z471 Aftercare following joint replacement surgery: Secondary | ICD-10-CM | POA: Diagnosis not present

## 2014-06-03 DIAGNOSIS — Z96651 Presence of right artificial knee joint: Secondary | ICD-10-CM | POA: Diagnosis not present

## 2014-06-04 DIAGNOSIS — M25561 Pain in right knee: Secondary | ICD-10-CM | POA: Diagnosis not present

## 2014-06-04 DIAGNOSIS — M25461 Effusion, right knee: Secondary | ICD-10-CM | POA: Diagnosis not present

## 2014-06-04 DIAGNOSIS — Z96651 Presence of right artificial knee joint: Secondary | ICD-10-CM | POA: Diagnosis not present

## 2014-06-04 DIAGNOSIS — M62551 Muscle wasting and atrophy, not elsewhere classified, right thigh: Secondary | ICD-10-CM | POA: Diagnosis not present

## 2014-06-05 DIAGNOSIS — M25561 Pain in right knee: Secondary | ICD-10-CM | POA: Diagnosis not present

## 2014-06-05 DIAGNOSIS — Z96651 Presence of right artificial knee joint: Secondary | ICD-10-CM | POA: Diagnosis not present

## 2014-06-05 DIAGNOSIS — M25461 Effusion, right knee: Secondary | ICD-10-CM | POA: Diagnosis not present

## 2014-06-05 DIAGNOSIS — M62551 Muscle wasting and atrophy, not elsewhere classified, right thigh: Secondary | ICD-10-CM | POA: Diagnosis not present

## 2014-06-09 DIAGNOSIS — M25561 Pain in right knee: Secondary | ICD-10-CM | POA: Diagnosis not present

## 2014-06-09 DIAGNOSIS — M25461 Effusion, right knee: Secondary | ICD-10-CM | POA: Diagnosis not present

## 2014-06-09 DIAGNOSIS — M62551 Muscle wasting and atrophy, not elsewhere classified, right thigh: Secondary | ICD-10-CM | POA: Diagnosis not present

## 2014-06-09 DIAGNOSIS — Z96651 Presence of right artificial knee joint: Secondary | ICD-10-CM | POA: Diagnosis not present

## 2014-06-11 DIAGNOSIS — M25461 Effusion, right knee: Secondary | ICD-10-CM | POA: Diagnosis not present

## 2014-06-11 DIAGNOSIS — Z96651 Presence of right artificial knee joint: Secondary | ICD-10-CM | POA: Diagnosis not present

## 2014-06-11 DIAGNOSIS — M62551 Muscle wasting and atrophy, not elsewhere classified, right thigh: Secondary | ICD-10-CM | POA: Diagnosis not present

## 2014-06-11 DIAGNOSIS — M25561 Pain in right knee: Secondary | ICD-10-CM | POA: Diagnosis not present

## 2014-06-12 DIAGNOSIS — Z96651 Presence of right artificial knee joint: Secondary | ICD-10-CM | POA: Diagnosis not present

## 2014-06-12 DIAGNOSIS — M25461 Effusion, right knee: Secondary | ICD-10-CM | POA: Diagnosis not present

## 2014-06-12 DIAGNOSIS — M62551 Muscle wasting and atrophy, not elsewhere classified, right thigh: Secondary | ICD-10-CM | POA: Diagnosis not present

## 2014-06-12 DIAGNOSIS — M25561 Pain in right knee: Secondary | ICD-10-CM | POA: Diagnosis not present

## 2014-06-13 DIAGNOSIS — M25461 Effusion, right knee: Secondary | ICD-10-CM | POA: Diagnosis not present

## 2014-06-13 DIAGNOSIS — M25561 Pain in right knee: Secondary | ICD-10-CM | POA: Diagnosis not present

## 2014-06-13 DIAGNOSIS — Z96651 Presence of right artificial knee joint: Secondary | ICD-10-CM | POA: Diagnosis not present

## 2014-06-13 DIAGNOSIS — M62551 Muscle wasting and atrophy, not elsewhere classified, right thigh: Secondary | ICD-10-CM | POA: Diagnosis not present

## 2014-06-16 DIAGNOSIS — M25461 Effusion, right knee: Secondary | ICD-10-CM | POA: Diagnosis not present

## 2014-06-16 DIAGNOSIS — M25561 Pain in right knee: Secondary | ICD-10-CM | POA: Diagnosis not present

## 2014-06-16 DIAGNOSIS — M62551 Muscle wasting and atrophy, not elsewhere classified, right thigh: Secondary | ICD-10-CM | POA: Diagnosis not present

## 2014-06-16 DIAGNOSIS — Z96651 Presence of right artificial knee joint: Secondary | ICD-10-CM | POA: Diagnosis not present

## 2014-06-17 DIAGNOSIS — Z96651 Presence of right artificial knee joint: Secondary | ICD-10-CM | POA: Diagnosis not present

## 2014-06-17 DIAGNOSIS — M25461 Effusion, right knee: Secondary | ICD-10-CM | POA: Diagnosis not present

## 2014-06-17 DIAGNOSIS — M25561 Pain in right knee: Secondary | ICD-10-CM | POA: Diagnosis not present

## 2014-06-17 DIAGNOSIS — M62551 Muscle wasting and atrophy, not elsewhere classified, right thigh: Secondary | ICD-10-CM | POA: Diagnosis not present

## 2014-06-18 DIAGNOSIS — M62551 Muscle wasting and atrophy, not elsewhere classified, right thigh: Secondary | ICD-10-CM | POA: Diagnosis not present

## 2014-06-18 DIAGNOSIS — M25461 Effusion, right knee: Secondary | ICD-10-CM | POA: Diagnosis not present

## 2014-06-18 DIAGNOSIS — M25561 Pain in right knee: Secondary | ICD-10-CM | POA: Diagnosis not present

## 2014-06-18 DIAGNOSIS — Z96651 Presence of right artificial knee joint: Secondary | ICD-10-CM | POA: Diagnosis not present

## 2014-06-20 DIAGNOSIS — M25461 Effusion, right knee: Secondary | ICD-10-CM | POA: Diagnosis not present

## 2014-06-20 DIAGNOSIS — M25561 Pain in right knee: Secondary | ICD-10-CM | POA: Diagnosis not present

## 2014-06-20 DIAGNOSIS — Z96651 Presence of right artificial knee joint: Secondary | ICD-10-CM | POA: Diagnosis not present

## 2014-06-20 DIAGNOSIS — M62551 Muscle wasting and atrophy, not elsewhere classified, right thigh: Secondary | ICD-10-CM | POA: Diagnosis not present

## 2014-06-23 DIAGNOSIS — M25561 Pain in right knee: Secondary | ICD-10-CM | POA: Diagnosis not present

## 2014-06-23 DIAGNOSIS — M25461 Effusion, right knee: Secondary | ICD-10-CM | POA: Diagnosis not present

## 2014-06-23 DIAGNOSIS — M62551 Muscle wasting and atrophy, not elsewhere classified, right thigh: Secondary | ICD-10-CM | POA: Diagnosis not present

## 2014-06-23 DIAGNOSIS — Z96651 Presence of right artificial knee joint: Secondary | ICD-10-CM | POA: Diagnosis not present

## 2014-06-24 DIAGNOSIS — M25461 Effusion, right knee: Secondary | ICD-10-CM | POA: Diagnosis not present

## 2014-06-24 DIAGNOSIS — M62551 Muscle wasting and atrophy, not elsewhere classified, right thigh: Secondary | ICD-10-CM | POA: Diagnosis not present

## 2014-06-24 DIAGNOSIS — M25561 Pain in right knee: Secondary | ICD-10-CM | POA: Diagnosis not present

## 2014-06-24 DIAGNOSIS — Z96651 Presence of right artificial knee joint: Secondary | ICD-10-CM | POA: Diagnosis not present

## 2014-06-26 DIAGNOSIS — Z96651 Presence of right artificial knee joint: Secondary | ICD-10-CM | POA: Diagnosis not present

## 2014-06-26 DIAGNOSIS — K589 Irritable bowel syndrome without diarrhea: Secondary | ICD-10-CM | POA: Diagnosis not present

## 2014-06-26 DIAGNOSIS — E039 Hypothyroidism, unspecified: Secondary | ICD-10-CM | POA: Diagnosis not present

## 2014-06-26 DIAGNOSIS — I129 Hypertensive chronic kidney disease with stage 1 through stage 4 chronic kidney disease, or unspecified chronic kidney disease: Secondary | ICD-10-CM | POA: Diagnosis not present

## 2014-06-26 DIAGNOSIS — Z79899 Other long term (current) drug therapy: Secondary | ICD-10-CM | POA: Diagnosis not present

## 2014-06-26 DIAGNOSIS — Z23 Encounter for immunization: Secondary | ICD-10-CM | POA: Diagnosis not present

## 2014-06-26 DIAGNOSIS — E1122 Type 2 diabetes mellitus with diabetic chronic kidney disease: Secondary | ICD-10-CM | POA: Diagnosis not present

## 2014-06-26 DIAGNOSIS — E559 Vitamin D deficiency, unspecified: Secondary | ICD-10-CM | POA: Diagnosis not present

## 2014-06-26 DIAGNOSIS — N183 Chronic kidney disease, stage 3 (moderate): Secondary | ICD-10-CM | POA: Diagnosis not present

## 2014-06-26 DIAGNOSIS — E785 Hyperlipidemia, unspecified: Secondary | ICD-10-CM | POA: Diagnosis not present

## 2014-06-26 DIAGNOSIS — M109 Gout, unspecified: Secondary | ICD-10-CM | POA: Diagnosis not present

## 2014-06-26 DIAGNOSIS — M62551 Muscle wasting and atrophy, not elsewhere classified, right thigh: Secondary | ICD-10-CM | POA: Diagnosis not present

## 2014-06-26 DIAGNOSIS — M25461 Effusion, right knee: Secondary | ICD-10-CM | POA: Diagnosis not present

## 2014-06-26 DIAGNOSIS — M25561 Pain in right knee: Secondary | ICD-10-CM | POA: Diagnosis not present

## 2014-06-30 DIAGNOSIS — Z96651 Presence of right artificial knee joint: Secondary | ICD-10-CM | POA: Diagnosis not present

## 2014-06-30 DIAGNOSIS — M25461 Effusion, right knee: Secondary | ICD-10-CM | POA: Diagnosis not present

## 2014-06-30 DIAGNOSIS — M62551 Muscle wasting and atrophy, not elsewhere classified, right thigh: Secondary | ICD-10-CM | POA: Diagnosis not present

## 2014-06-30 DIAGNOSIS — M25561 Pain in right knee: Secondary | ICD-10-CM | POA: Diagnosis not present

## 2014-07-02 DIAGNOSIS — Z96651 Presence of right artificial knee joint: Secondary | ICD-10-CM | POA: Diagnosis not present

## 2014-07-02 DIAGNOSIS — M62551 Muscle wasting and atrophy, not elsewhere classified, right thigh: Secondary | ICD-10-CM | POA: Diagnosis not present

## 2014-07-02 DIAGNOSIS — M25561 Pain in right knee: Secondary | ICD-10-CM | POA: Diagnosis not present

## 2014-07-02 DIAGNOSIS — M25461 Effusion, right knee: Secondary | ICD-10-CM | POA: Diagnosis not present

## 2014-07-07 DIAGNOSIS — Z96651 Presence of right artificial knee joint: Secondary | ICD-10-CM | POA: Diagnosis not present

## 2014-07-07 DIAGNOSIS — M62551 Muscle wasting and atrophy, not elsewhere classified, right thigh: Secondary | ICD-10-CM | POA: Diagnosis not present

## 2014-07-07 DIAGNOSIS — M25561 Pain in right knee: Secondary | ICD-10-CM | POA: Diagnosis not present

## 2014-07-07 DIAGNOSIS — M25461 Effusion, right knee: Secondary | ICD-10-CM | POA: Diagnosis not present

## 2014-07-09 DIAGNOSIS — M25461 Effusion, right knee: Secondary | ICD-10-CM | POA: Diagnosis not present

## 2014-07-09 DIAGNOSIS — M25561 Pain in right knee: Secondary | ICD-10-CM | POA: Diagnosis not present

## 2014-07-09 DIAGNOSIS — M62551 Muscle wasting and atrophy, not elsewhere classified, right thigh: Secondary | ICD-10-CM | POA: Diagnosis not present

## 2014-07-09 DIAGNOSIS — Z96651 Presence of right artificial knee joint: Secondary | ICD-10-CM | POA: Diagnosis not present

## 2014-07-11 DIAGNOSIS — M25561 Pain in right knee: Secondary | ICD-10-CM | POA: Diagnosis not present

## 2014-07-11 DIAGNOSIS — Z96651 Presence of right artificial knee joint: Secondary | ICD-10-CM | POA: Diagnosis not present

## 2014-07-11 DIAGNOSIS — M62551 Muscle wasting and atrophy, not elsewhere classified, right thigh: Secondary | ICD-10-CM | POA: Diagnosis not present

## 2014-07-11 DIAGNOSIS — M25461 Effusion, right knee: Secondary | ICD-10-CM | POA: Diagnosis not present

## 2014-07-14 DIAGNOSIS — M25461 Effusion, right knee: Secondary | ICD-10-CM | POA: Diagnosis not present

## 2014-07-14 DIAGNOSIS — M62551 Muscle wasting and atrophy, not elsewhere classified, right thigh: Secondary | ICD-10-CM | POA: Diagnosis not present

## 2014-07-14 DIAGNOSIS — M25561 Pain in right knee: Secondary | ICD-10-CM | POA: Diagnosis not present

## 2014-07-14 DIAGNOSIS — Z96651 Presence of right artificial knee joint: Secondary | ICD-10-CM | POA: Diagnosis not present

## 2014-07-16 DIAGNOSIS — M25561 Pain in right knee: Secondary | ICD-10-CM | POA: Diagnosis not present

## 2014-07-16 DIAGNOSIS — M62551 Muscle wasting and atrophy, not elsewhere classified, right thigh: Secondary | ICD-10-CM | POA: Diagnosis not present

## 2014-07-16 DIAGNOSIS — Z96651 Presence of right artificial knee joint: Secondary | ICD-10-CM | POA: Diagnosis not present

## 2014-07-16 DIAGNOSIS — M25461 Effusion, right knee: Secondary | ICD-10-CM | POA: Diagnosis not present

## 2014-07-21 DIAGNOSIS — M25461 Effusion, right knee: Secondary | ICD-10-CM | POA: Diagnosis not present

## 2014-07-21 DIAGNOSIS — Z96651 Presence of right artificial knee joint: Secondary | ICD-10-CM | POA: Diagnosis not present

## 2014-07-21 DIAGNOSIS — M25561 Pain in right knee: Secondary | ICD-10-CM | POA: Diagnosis not present

## 2014-07-21 DIAGNOSIS — M62551 Muscle wasting and atrophy, not elsewhere classified, right thigh: Secondary | ICD-10-CM | POA: Diagnosis not present

## 2014-07-24 DIAGNOSIS — M62551 Muscle wasting and atrophy, not elsewhere classified, right thigh: Secondary | ICD-10-CM | POA: Diagnosis not present

## 2014-07-24 DIAGNOSIS — M25561 Pain in right knee: Secondary | ICD-10-CM | POA: Diagnosis not present

## 2014-07-24 DIAGNOSIS — M25461 Effusion, right knee: Secondary | ICD-10-CM | POA: Diagnosis not present

## 2014-07-24 DIAGNOSIS — Z96651 Presence of right artificial knee joint: Secondary | ICD-10-CM | POA: Diagnosis not present

## 2014-07-28 DIAGNOSIS — M62551 Muscle wasting and atrophy, not elsewhere classified, right thigh: Secondary | ICD-10-CM | POA: Diagnosis not present

## 2014-07-28 DIAGNOSIS — M25461 Effusion, right knee: Secondary | ICD-10-CM | POA: Diagnosis not present

## 2014-07-28 DIAGNOSIS — Z96651 Presence of right artificial knee joint: Secondary | ICD-10-CM | POA: Diagnosis not present

## 2014-07-28 DIAGNOSIS — M25561 Pain in right knee: Secondary | ICD-10-CM | POA: Diagnosis not present

## 2014-07-31 DIAGNOSIS — M25561 Pain in right knee: Secondary | ICD-10-CM | POA: Diagnosis not present

## 2014-07-31 DIAGNOSIS — M62551 Muscle wasting and atrophy, not elsewhere classified, right thigh: Secondary | ICD-10-CM | POA: Diagnosis not present

## 2014-07-31 DIAGNOSIS — M25461 Effusion, right knee: Secondary | ICD-10-CM | POA: Diagnosis not present

## 2014-07-31 DIAGNOSIS — Z96651 Presence of right artificial knee joint: Secondary | ICD-10-CM | POA: Diagnosis not present

## 2014-08-04 DIAGNOSIS — M62551 Muscle wasting and atrophy, not elsewhere classified, right thigh: Secondary | ICD-10-CM | POA: Diagnosis not present

## 2014-08-04 DIAGNOSIS — M25461 Effusion, right knee: Secondary | ICD-10-CM | POA: Diagnosis not present

## 2014-08-04 DIAGNOSIS — M25561 Pain in right knee: Secondary | ICD-10-CM | POA: Diagnosis not present

## 2014-08-04 DIAGNOSIS — Z96651 Presence of right artificial knee joint: Secondary | ICD-10-CM | POA: Diagnosis not present

## 2014-08-06 DIAGNOSIS — Z96651 Presence of right artificial knee joint: Secondary | ICD-10-CM | POA: Diagnosis not present

## 2014-08-06 DIAGNOSIS — M25561 Pain in right knee: Secondary | ICD-10-CM | POA: Diagnosis not present

## 2014-08-06 DIAGNOSIS — M25461 Effusion, right knee: Secondary | ICD-10-CM | POA: Diagnosis not present

## 2014-08-06 DIAGNOSIS — M62551 Muscle wasting and atrophy, not elsewhere classified, right thigh: Secondary | ICD-10-CM | POA: Diagnosis not present

## 2014-08-11 DIAGNOSIS — M62551 Muscle wasting and atrophy, not elsewhere classified, right thigh: Secondary | ICD-10-CM | POA: Diagnosis not present

## 2014-08-11 DIAGNOSIS — Z96651 Presence of right artificial knee joint: Secondary | ICD-10-CM | POA: Diagnosis not present

## 2014-08-11 DIAGNOSIS — M25461 Effusion, right knee: Secondary | ICD-10-CM | POA: Diagnosis not present

## 2014-08-11 DIAGNOSIS — M25561 Pain in right knee: Secondary | ICD-10-CM | POA: Diagnosis not present

## 2014-08-12 DIAGNOSIS — M25461 Effusion, right knee: Secondary | ICD-10-CM | POA: Diagnosis not present

## 2014-08-12 DIAGNOSIS — M62551 Muscle wasting and atrophy, not elsewhere classified, right thigh: Secondary | ICD-10-CM | POA: Diagnosis not present

## 2014-08-12 DIAGNOSIS — Z96651 Presence of right artificial knee joint: Secondary | ICD-10-CM | POA: Diagnosis not present

## 2014-08-12 DIAGNOSIS — M25561 Pain in right knee: Secondary | ICD-10-CM | POA: Diagnosis not present

## 2014-08-13 DIAGNOSIS — M25561 Pain in right knee: Secondary | ICD-10-CM | POA: Diagnosis not present

## 2014-08-13 DIAGNOSIS — M62551 Muscle wasting and atrophy, not elsewhere classified, right thigh: Secondary | ICD-10-CM | POA: Diagnosis not present

## 2014-08-13 DIAGNOSIS — Z96651 Presence of right artificial knee joint: Secondary | ICD-10-CM | POA: Diagnosis not present

## 2014-08-13 DIAGNOSIS — M25461 Effusion, right knee: Secondary | ICD-10-CM | POA: Diagnosis not present

## 2014-08-14 DIAGNOSIS — Z96651 Presence of right artificial knee joint: Secondary | ICD-10-CM | POA: Diagnosis not present

## 2014-08-14 DIAGNOSIS — M25461 Effusion, right knee: Secondary | ICD-10-CM | POA: Diagnosis not present

## 2014-08-14 DIAGNOSIS — M25561 Pain in right knee: Secondary | ICD-10-CM | POA: Diagnosis not present

## 2014-08-14 DIAGNOSIS — M62551 Muscle wasting and atrophy, not elsewhere classified, right thigh: Secondary | ICD-10-CM | POA: Diagnosis not present

## 2014-08-18 DIAGNOSIS — N183 Chronic kidney disease, stage 3 (moderate): Secondary | ICD-10-CM | POA: Diagnosis not present

## 2014-08-18 DIAGNOSIS — M25461 Effusion, right knee: Secondary | ICD-10-CM | POA: Diagnosis not present

## 2014-08-18 DIAGNOSIS — M25561 Pain in right knee: Secondary | ICD-10-CM | POA: Diagnosis not present

## 2014-08-18 DIAGNOSIS — Z96651 Presence of right artificial knee joint: Secondary | ICD-10-CM | POA: Diagnosis not present

## 2014-08-18 DIAGNOSIS — M62551 Muscle wasting and atrophy, not elsewhere classified, right thigh: Secondary | ICD-10-CM | POA: Diagnosis not present

## 2014-08-19 DIAGNOSIS — L821 Other seborrheic keratosis: Secondary | ICD-10-CM | POA: Diagnosis not present

## 2014-08-19 DIAGNOSIS — Z96651 Presence of right artificial knee joint: Secondary | ICD-10-CM | POA: Diagnosis not present

## 2014-08-19 DIAGNOSIS — M25561 Pain in right knee: Secondary | ICD-10-CM | POA: Diagnosis not present

## 2014-08-19 DIAGNOSIS — C4359 Malignant melanoma of other part of trunk: Secondary | ICD-10-CM | POA: Diagnosis not present

## 2014-08-19 DIAGNOSIS — L570 Actinic keratosis: Secondary | ICD-10-CM | POA: Diagnosis not present

## 2014-08-19 DIAGNOSIS — M25461 Effusion, right knee: Secondary | ICD-10-CM | POA: Diagnosis not present

## 2014-08-19 DIAGNOSIS — L578 Other skin changes due to chronic exposure to nonionizing radiation: Secondary | ICD-10-CM | POA: Diagnosis not present

## 2014-08-19 DIAGNOSIS — M62551 Muscle wasting and atrophy, not elsewhere classified, right thigh: Secondary | ICD-10-CM | POA: Diagnosis not present

## 2014-08-20 DIAGNOSIS — M62551 Muscle wasting and atrophy, not elsewhere classified, right thigh: Secondary | ICD-10-CM | POA: Diagnosis not present

## 2014-08-20 DIAGNOSIS — M25561 Pain in right knee: Secondary | ICD-10-CM | POA: Diagnosis not present

## 2014-08-20 DIAGNOSIS — Z96651 Presence of right artificial knee joint: Secondary | ICD-10-CM | POA: Diagnosis not present

## 2014-08-20 DIAGNOSIS — M25461 Effusion, right knee: Secondary | ICD-10-CM | POA: Diagnosis not present

## 2014-08-22 DIAGNOSIS — M25461 Effusion, right knee: Secondary | ICD-10-CM | POA: Diagnosis not present

## 2014-08-22 DIAGNOSIS — M62551 Muscle wasting and atrophy, not elsewhere classified, right thigh: Secondary | ICD-10-CM | POA: Diagnosis not present

## 2014-08-22 DIAGNOSIS — Z96651 Presence of right artificial knee joint: Secondary | ICD-10-CM | POA: Diagnosis not present

## 2014-08-22 DIAGNOSIS — M25561 Pain in right knee: Secondary | ICD-10-CM | POA: Diagnosis not present

## 2014-08-25 DIAGNOSIS — M25461 Effusion, right knee: Secondary | ICD-10-CM | POA: Diagnosis not present

## 2014-08-25 DIAGNOSIS — M62551 Muscle wasting and atrophy, not elsewhere classified, right thigh: Secondary | ICD-10-CM | POA: Diagnosis not present

## 2014-08-25 DIAGNOSIS — M25561 Pain in right knee: Secondary | ICD-10-CM | POA: Diagnosis not present

## 2014-08-25 DIAGNOSIS — Z96651 Presence of right artificial knee joint: Secondary | ICD-10-CM | POA: Diagnosis not present

## 2014-08-26 DIAGNOSIS — M62551 Muscle wasting and atrophy, not elsewhere classified, right thigh: Secondary | ICD-10-CM | POA: Diagnosis not present

## 2014-08-26 DIAGNOSIS — Z96651 Presence of right artificial knee joint: Secondary | ICD-10-CM | POA: Diagnosis not present

## 2014-08-26 DIAGNOSIS — M25561 Pain in right knee: Secondary | ICD-10-CM | POA: Diagnosis not present

## 2014-08-26 DIAGNOSIS — M25461 Effusion, right knee: Secondary | ICD-10-CM | POA: Diagnosis not present

## 2014-08-28 DIAGNOSIS — M25561 Pain in right knee: Secondary | ICD-10-CM | POA: Diagnosis not present

## 2014-08-28 DIAGNOSIS — M62551 Muscle wasting and atrophy, not elsewhere classified, right thigh: Secondary | ICD-10-CM | POA: Diagnosis not present

## 2014-08-28 DIAGNOSIS — Z96651 Presence of right artificial knee joint: Secondary | ICD-10-CM | POA: Diagnosis not present

## 2014-08-28 DIAGNOSIS — M25461 Effusion, right knee: Secondary | ICD-10-CM | POA: Diagnosis not present

## 2014-09-01 DIAGNOSIS — M25561 Pain in right knee: Secondary | ICD-10-CM | POA: Diagnosis not present

## 2014-09-01 DIAGNOSIS — M25461 Effusion, right knee: Secondary | ICD-10-CM | POA: Diagnosis not present

## 2014-09-01 DIAGNOSIS — Z96651 Presence of right artificial knee joint: Secondary | ICD-10-CM | POA: Diagnosis not present

## 2014-09-01 DIAGNOSIS — M62551 Muscle wasting and atrophy, not elsewhere classified, right thigh: Secondary | ICD-10-CM | POA: Diagnosis not present

## 2014-09-02 DIAGNOSIS — E559 Vitamin D deficiency, unspecified: Secondary | ICD-10-CM | POA: Diagnosis not present

## 2014-09-02 DIAGNOSIS — M9080 Osteopathy in diseases classified elsewhere, unspecified site: Secondary | ICD-10-CM | POA: Diagnosis not present

## 2014-09-02 DIAGNOSIS — N183 Chronic kidney disease, stage 3 (moderate): Secondary | ICD-10-CM | POA: Diagnosis not present

## 2014-09-02 DIAGNOSIS — E1122 Type 2 diabetes mellitus with diabetic chronic kidney disease: Secondary | ICD-10-CM | POA: Diagnosis not present

## 2014-09-02 DIAGNOSIS — M109 Gout, unspecified: Secondary | ICD-10-CM | POA: Diagnosis not present

## 2014-09-02 DIAGNOSIS — I129 Hypertensive chronic kidney disease with stage 1 through stage 4 chronic kidney disease, or unspecified chronic kidney disease: Secondary | ICD-10-CM | POA: Diagnosis not present

## 2014-09-02 DIAGNOSIS — E889 Metabolic disorder, unspecified: Secondary | ICD-10-CM | POA: Diagnosis not present

## 2014-09-03 DIAGNOSIS — M25561 Pain in right knee: Secondary | ICD-10-CM | POA: Diagnosis not present

## 2014-09-03 DIAGNOSIS — Z96651 Presence of right artificial knee joint: Secondary | ICD-10-CM | POA: Diagnosis not present

## 2014-09-03 DIAGNOSIS — M25461 Effusion, right knee: Secondary | ICD-10-CM | POA: Diagnosis not present

## 2014-09-03 DIAGNOSIS — M62551 Muscle wasting and atrophy, not elsewhere classified, right thigh: Secondary | ICD-10-CM | POA: Diagnosis not present

## 2014-09-05 DIAGNOSIS — M25561 Pain in right knee: Secondary | ICD-10-CM | POA: Diagnosis not present

## 2014-09-05 DIAGNOSIS — M62551 Muscle wasting and atrophy, not elsewhere classified, right thigh: Secondary | ICD-10-CM | POA: Diagnosis not present

## 2014-09-05 DIAGNOSIS — M25461 Effusion, right knee: Secondary | ICD-10-CM | POA: Diagnosis not present

## 2014-09-05 DIAGNOSIS — Z96651 Presence of right artificial knee joint: Secondary | ICD-10-CM | POA: Diagnosis not present

## 2014-09-08 DIAGNOSIS — M62551 Muscle wasting and atrophy, not elsewhere classified, right thigh: Secondary | ICD-10-CM | POA: Diagnosis not present

## 2014-09-08 DIAGNOSIS — M25461 Effusion, right knee: Secondary | ICD-10-CM | POA: Diagnosis not present

## 2014-09-08 DIAGNOSIS — M25561 Pain in right knee: Secondary | ICD-10-CM | POA: Diagnosis not present

## 2014-09-08 DIAGNOSIS — Z96651 Presence of right artificial knee joint: Secondary | ICD-10-CM | POA: Diagnosis not present

## 2014-09-10 DIAGNOSIS — M25561 Pain in right knee: Secondary | ICD-10-CM | POA: Diagnosis not present

## 2014-09-10 DIAGNOSIS — M25461 Effusion, right knee: Secondary | ICD-10-CM | POA: Diagnosis not present

## 2014-09-10 DIAGNOSIS — M62551 Muscle wasting and atrophy, not elsewhere classified, right thigh: Secondary | ICD-10-CM | POA: Diagnosis not present

## 2014-09-10 DIAGNOSIS — Z96651 Presence of right artificial knee joint: Secondary | ICD-10-CM | POA: Diagnosis not present

## 2014-09-11 DIAGNOSIS — Z96651 Presence of right artificial knee joint: Secondary | ICD-10-CM | POA: Diagnosis not present

## 2014-10-23 DIAGNOSIS — Z79899 Other long term (current) drug therapy: Secondary | ICD-10-CM | POA: Diagnosis not present

## 2014-10-23 DIAGNOSIS — E039 Hypothyroidism, unspecified: Secondary | ICD-10-CM | POA: Diagnosis not present

## 2014-10-23 DIAGNOSIS — M109 Gout, unspecified: Secondary | ICD-10-CM | POA: Diagnosis not present

## 2014-10-23 DIAGNOSIS — E1122 Type 2 diabetes mellitus with diabetic chronic kidney disease: Secondary | ICD-10-CM | POA: Diagnosis not present

## 2014-10-23 DIAGNOSIS — I129 Hypertensive chronic kidney disease with stage 1 through stage 4 chronic kidney disease, or unspecified chronic kidney disease: Secondary | ICD-10-CM | POA: Diagnosis not present

## 2014-10-23 DIAGNOSIS — N183 Chronic kidney disease, stage 3 (moderate): Secondary | ICD-10-CM | POA: Diagnosis not present

## 2014-10-23 DIAGNOSIS — E559 Vitamin D deficiency, unspecified: Secondary | ICD-10-CM | POA: Diagnosis not present

## 2014-10-23 DIAGNOSIS — E785 Hyperlipidemia, unspecified: Secondary | ICD-10-CM | POA: Diagnosis not present

## 2014-10-23 DIAGNOSIS — K210 Gastro-esophageal reflux disease with esophagitis: Secondary | ICD-10-CM | POA: Diagnosis not present

## 2014-12-11 DIAGNOSIS — Z96651 Presence of right artificial knee joint: Secondary | ICD-10-CM | POA: Diagnosis not present

## 2014-12-22 DIAGNOSIS — H25012 Cortical age-related cataract, left eye: Secondary | ICD-10-CM | POA: Diagnosis not present

## 2014-12-22 DIAGNOSIS — H04123 Dry eye syndrome of bilateral lacrimal glands: Secondary | ICD-10-CM | POA: Diagnosis not present

## 2014-12-22 DIAGNOSIS — H40013 Open angle with borderline findings, low risk, bilateral: Secondary | ICD-10-CM | POA: Diagnosis not present

## 2014-12-22 DIAGNOSIS — E119 Type 2 diabetes mellitus without complications: Secondary | ICD-10-CM | POA: Diagnosis not present

## 2014-12-22 DIAGNOSIS — H2512 Age-related nuclear cataract, left eye: Secondary | ICD-10-CM | POA: Diagnosis not present

## 2014-12-22 DIAGNOSIS — H43393 Other vitreous opacities, bilateral: Secondary | ICD-10-CM | POA: Diagnosis not present

## 2014-12-25 DIAGNOSIS — E1122 Type 2 diabetes mellitus with diabetic chronic kidney disease: Secondary | ICD-10-CM | POA: Diagnosis not present

## 2014-12-25 DIAGNOSIS — N183 Chronic kidney disease, stage 3 (moderate): Secondary | ICD-10-CM | POA: Diagnosis not present

## 2015-01-05 DIAGNOSIS — M109 Gout, unspecified: Secondary | ICD-10-CM | POA: Diagnosis not present

## 2015-01-05 DIAGNOSIS — M9080 Osteopathy in diseases classified elsewhere, unspecified site: Secondary | ICD-10-CM | POA: Diagnosis not present

## 2015-01-05 DIAGNOSIS — I129 Hypertensive chronic kidney disease with stage 1 through stage 4 chronic kidney disease, or unspecified chronic kidney disease: Secondary | ICD-10-CM | POA: Diagnosis not present

## 2015-01-05 DIAGNOSIS — E559 Vitamin D deficiency, unspecified: Secondary | ICD-10-CM | POA: Diagnosis not present

## 2015-01-05 DIAGNOSIS — E889 Metabolic disorder, unspecified: Secondary | ICD-10-CM | POA: Diagnosis not present

## 2015-01-05 DIAGNOSIS — E1122 Type 2 diabetes mellitus with diabetic chronic kidney disease: Secondary | ICD-10-CM | POA: Diagnosis not present

## 2015-01-05 DIAGNOSIS — N183 Chronic kidney disease, stage 3 (moderate): Secondary | ICD-10-CM | POA: Diagnosis not present

## 2015-01-27 DIAGNOSIS — H2512 Age-related nuclear cataract, left eye: Secondary | ICD-10-CM | POA: Diagnosis not present

## 2015-02-11 DIAGNOSIS — R05 Cough: Secondary | ICD-10-CM | POA: Diagnosis not present

## 2015-02-11 DIAGNOSIS — N3000 Acute cystitis without hematuria: Secondary | ICD-10-CM | POA: Diagnosis not present

## 2015-02-11 DIAGNOSIS — J309 Allergic rhinitis, unspecified: Secondary | ICD-10-CM | POA: Diagnosis not present

## 2015-02-11 DIAGNOSIS — R300 Dysuria: Secondary | ICD-10-CM | POA: Diagnosis not present

## 2015-02-18 DIAGNOSIS — H25011 Cortical age-related cataract, right eye: Secondary | ICD-10-CM | POA: Diagnosis not present

## 2015-02-18 DIAGNOSIS — H2511 Age-related nuclear cataract, right eye: Secondary | ICD-10-CM | POA: Diagnosis not present

## 2015-02-25 DIAGNOSIS — M109 Gout, unspecified: Secondary | ICD-10-CM | POA: Diagnosis not present

## 2015-02-25 DIAGNOSIS — Z0000 Encounter for general adult medical examination without abnormal findings: Secondary | ICD-10-CM | POA: Diagnosis not present

## 2015-02-25 DIAGNOSIS — E039 Hypothyroidism, unspecified: Secondary | ICD-10-CM | POA: Diagnosis not present

## 2015-02-25 DIAGNOSIS — K210 Gastro-esophageal reflux disease with esophagitis: Secondary | ICD-10-CM | POA: Diagnosis not present

## 2015-02-25 DIAGNOSIS — Z13820 Encounter for screening for osteoporosis: Secondary | ICD-10-CM | POA: Diagnosis not present

## 2015-02-25 DIAGNOSIS — N183 Chronic kidney disease, stage 3 (moderate): Secondary | ICD-10-CM | POA: Diagnosis not present

## 2015-02-25 DIAGNOSIS — Z136 Encounter for screening for cardiovascular disorders: Secondary | ICD-10-CM | POA: Diagnosis not present

## 2015-02-25 DIAGNOSIS — E785 Hyperlipidemia, unspecified: Secondary | ICD-10-CM | POA: Diagnosis not present

## 2015-02-25 DIAGNOSIS — I129 Hypertensive chronic kidney disease with stage 1 through stage 4 chronic kidney disease, or unspecified chronic kidney disease: Secondary | ICD-10-CM | POA: Diagnosis not present

## 2015-02-25 DIAGNOSIS — Z23 Encounter for immunization: Secondary | ICD-10-CM | POA: Diagnosis not present

## 2015-02-25 DIAGNOSIS — E559 Vitamin D deficiency, unspecified: Secondary | ICD-10-CM | POA: Diagnosis not present

## 2015-02-25 DIAGNOSIS — Z1211 Encounter for screening for malignant neoplasm of colon: Secondary | ICD-10-CM | POA: Diagnosis not present

## 2015-02-25 DIAGNOSIS — E1122 Type 2 diabetes mellitus with diabetic chronic kidney disease: Secondary | ICD-10-CM | POA: Diagnosis not present

## 2015-02-25 DIAGNOSIS — Z79899 Other long term (current) drug therapy: Secondary | ICD-10-CM | POA: Diagnosis not present

## 2015-03-03 DIAGNOSIS — H2511 Age-related nuclear cataract, right eye: Secondary | ICD-10-CM | POA: Diagnosis not present

## 2015-03-03 DIAGNOSIS — Z1211 Encounter for screening for malignant neoplasm of colon: Secondary | ICD-10-CM | POA: Diagnosis not present

## 2015-03-24 DIAGNOSIS — D485 Neoplasm of uncertain behavior of skin: Secondary | ICD-10-CM | POA: Diagnosis not present

## 2015-03-24 DIAGNOSIS — L820 Inflamed seborrheic keratosis: Secondary | ICD-10-CM | POA: Diagnosis not present

## 2015-04-16 DIAGNOSIS — Z1231 Encounter for screening mammogram for malignant neoplasm of breast: Secondary | ICD-10-CM | POA: Diagnosis not present

## 2015-06-11 DIAGNOSIS — M25461 Effusion, right knee: Secondary | ICD-10-CM | POA: Diagnosis not present

## 2015-06-11 DIAGNOSIS — M25462 Effusion, left knee: Secondary | ICD-10-CM | POA: Diagnosis not present

## 2015-06-11 DIAGNOSIS — Z96651 Presence of right artificial knee joint: Secondary | ICD-10-CM | POA: Diagnosis not present

## 2015-06-11 DIAGNOSIS — Z96652 Presence of left artificial knee joint: Secondary | ICD-10-CM | POA: Diagnosis not present

## 2015-06-15 DIAGNOSIS — E1122 Type 2 diabetes mellitus with diabetic chronic kidney disease: Secondary | ICD-10-CM | POA: Diagnosis not present

## 2015-06-15 DIAGNOSIS — N183 Chronic kidney disease, stage 3 (moderate): Secondary | ICD-10-CM | POA: Diagnosis not present

## 2015-06-15 DIAGNOSIS — I129 Hypertensive chronic kidney disease with stage 1 through stage 4 chronic kidney disease, or unspecified chronic kidney disease: Secondary | ICD-10-CM | POA: Diagnosis not present

## 2015-06-18 DIAGNOSIS — N183 Chronic kidney disease, stage 3 (moderate): Secondary | ICD-10-CM | POA: Diagnosis not present

## 2015-06-18 DIAGNOSIS — M9080 Osteopathy in diseases classified elsewhere, unspecified site: Secondary | ICD-10-CM | POA: Diagnosis not present

## 2015-06-18 DIAGNOSIS — M109 Gout, unspecified: Secondary | ICD-10-CM | POA: Diagnosis not present

## 2015-06-18 DIAGNOSIS — E1122 Type 2 diabetes mellitus with diabetic chronic kidney disease: Secondary | ICD-10-CM | POA: Diagnosis not present

## 2015-06-18 DIAGNOSIS — E889 Metabolic disorder, unspecified: Secondary | ICD-10-CM | POA: Diagnosis not present

## 2015-06-18 DIAGNOSIS — I129 Hypertensive chronic kidney disease with stage 1 through stage 4 chronic kidney disease, or unspecified chronic kidney disease: Secondary | ICD-10-CM | POA: Diagnosis not present

## 2015-07-22 DIAGNOSIS — M65311 Trigger thumb, right thumb: Secondary | ICD-10-CM | POA: Diagnosis not present

## 2015-07-27 DIAGNOSIS — M65311 Trigger thumb, right thumb: Secondary | ICD-10-CM | POA: Diagnosis not present

## 2015-08-24 DIAGNOSIS — D0359 Melanoma in situ of other part of trunk: Secondary | ICD-10-CM | POA: Diagnosis not present

## 2015-08-24 DIAGNOSIS — L814 Other melanin hyperpigmentation: Secondary | ICD-10-CM | POA: Diagnosis not present

## 2015-08-24 DIAGNOSIS — L821 Other seborrheic keratosis: Secondary | ICD-10-CM | POA: Diagnosis not present

## 2015-08-26 DIAGNOSIS — E039 Hypothyroidism, unspecified: Secondary | ICD-10-CM | POA: Diagnosis not present

## 2015-08-26 DIAGNOSIS — E785 Hyperlipidemia, unspecified: Secondary | ICD-10-CM | POA: Diagnosis not present

## 2015-08-26 DIAGNOSIS — M109 Gout, unspecified: Secondary | ICD-10-CM | POA: Diagnosis not present

## 2015-08-26 DIAGNOSIS — E1122 Type 2 diabetes mellitus with diabetic chronic kidney disease: Secondary | ICD-10-CM | POA: Diagnosis not present

## 2015-08-26 DIAGNOSIS — Z87898 Personal history of other specified conditions: Secondary | ICD-10-CM | POA: Diagnosis not present

## 2015-08-26 DIAGNOSIS — N183 Chronic kidney disease, stage 3 (moderate): Secondary | ICD-10-CM | POA: Diagnosis not present

## 2015-08-26 DIAGNOSIS — D039 Melanoma in situ, unspecified: Secondary | ICD-10-CM | POA: Diagnosis not present

## 2015-08-26 DIAGNOSIS — Z79899 Other long term (current) drug therapy: Secondary | ICD-10-CM | POA: Diagnosis not present

## 2015-08-26 DIAGNOSIS — D0359 Melanoma in situ of other part of trunk: Secondary | ICD-10-CM | POA: Diagnosis not present

## 2015-08-26 DIAGNOSIS — E559 Vitamin D deficiency, unspecified: Secondary | ICD-10-CM | POA: Diagnosis not present

## 2015-09-15 DIAGNOSIS — D0359 Melanoma in situ of other part of trunk: Secondary | ICD-10-CM | POA: Diagnosis not present

## 2015-09-30 DIAGNOSIS — H43393 Other vitreous opacities, bilateral: Secondary | ICD-10-CM | POA: Diagnosis not present

## 2015-09-30 DIAGNOSIS — E119 Type 2 diabetes mellitus without complications: Secondary | ICD-10-CM | POA: Diagnosis not present

## 2015-09-30 DIAGNOSIS — H40013 Open angle with borderline findings, low risk, bilateral: Secondary | ICD-10-CM | POA: Diagnosis not present

## 2015-09-30 DIAGNOSIS — Z961 Presence of intraocular lens: Secondary | ICD-10-CM | POA: Diagnosis not present

## 2015-10-01 DIAGNOSIS — M25511 Pain in right shoulder: Secondary | ICD-10-CM | POA: Diagnosis not present

## 2015-10-01 DIAGNOSIS — N183 Chronic kidney disease, stage 3 (moderate): Secondary | ICD-10-CM | POA: Diagnosis not present

## 2015-10-01 DIAGNOSIS — E1122 Type 2 diabetes mellitus with diabetic chronic kidney disease: Secondary | ICD-10-CM | POA: Diagnosis not present

## 2015-10-01 DIAGNOSIS — M542 Cervicalgia: Secondary | ICD-10-CM | POA: Diagnosis not present

## 2015-10-07 DIAGNOSIS — M542 Cervicalgia: Secondary | ICD-10-CM | POA: Diagnosis not present

## 2015-12-23 DIAGNOSIS — H2012 Chronic iridocyclitis, left eye: Secondary | ICD-10-CM | POA: Diagnosis not present

## 2015-12-23 DIAGNOSIS — H5712 Ocular pain, left eye: Secondary | ICD-10-CM | POA: Diagnosis not present

## 2015-12-23 DIAGNOSIS — H26493 Other secondary cataract, bilateral: Secondary | ICD-10-CM | POA: Diagnosis not present

## 2015-12-29 DIAGNOSIS — N183 Chronic kidney disease, stage 3 (moderate): Secondary | ICD-10-CM | POA: Diagnosis not present

## 2016-01-05 DIAGNOSIS — I129 Hypertensive chronic kidney disease with stage 1 through stage 4 chronic kidney disease, or unspecified chronic kidney disease: Secondary | ICD-10-CM | POA: Insufficient documentation

## 2016-01-05 DIAGNOSIS — E889 Metabolic disorder, unspecified: Secondary | ICD-10-CM | POA: Diagnosis not present

## 2016-01-05 DIAGNOSIS — E559 Vitamin D deficiency, unspecified: Secondary | ICD-10-CM | POA: Diagnosis not present

## 2016-01-05 DIAGNOSIS — N183 Chronic kidney disease, stage 3 (moderate): Secondary | ICD-10-CM | POA: Diagnosis not present

## 2016-01-05 DIAGNOSIS — M9080 Osteopathy in diseases classified elsewhere, unspecified site: Secondary | ICD-10-CM | POA: Diagnosis not present

## 2016-01-05 DIAGNOSIS — M898X9 Other specified disorders of bone, unspecified site: Secondary | ICD-10-CM | POA: Insufficient documentation

## 2016-01-07 DIAGNOSIS — H5712 Ocular pain, left eye: Secondary | ICD-10-CM | POA: Diagnosis not present

## 2016-01-07 DIAGNOSIS — H2012 Chronic iridocyclitis, left eye: Secondary | ICD-10-CM | POA: Diagnosis not present

## 2016-01-14 DIAGNOSIS — R300 Dysuria: Secondary | ICD-10-CM | POA: Diagnosis not present

## 2016-01-14 DIAGNOSIS — S41111A Laceration without foreign body of right upper arm, initial encounter: Secondary | ICD-10-CM | POA: Diagnosis not present

## 2016-02-08 DIAGNOSIS — E1122 Type 2 diabetes mellitus with diabetic chronic kidney disease: Secondary | ICD-10-CM | POA: Diagnosis not present

## 2016-02-08 DIAGNOSIS — R300 Dysuria: Secondary | ICD-10-CM | POA: Diagnosis not present

## 2016-02-08 DIAGNOSIS — R8290 Unspecified abnormal findings in urine: Secondary | ICD-10-CM | POA: Diagnosis not present

## 2016-02-11 DIAGNOSIS — H2012 Chronic iridocyclitis, left eye: Secondary | ICD-10-CM | POA: Diagnosis not present

## 2016-02-11 DIAGNOSIS — H26491 Other secondary cataract, right eye: Secondary | ICD-10-CM | POA: Diagnosis not present

## 2016-02-11 DIAGNOSIS — H26493 Other secondary cataract, bilateral: Secondary | ICD-10-CM | POA: Diagnosis not present

## 2016-02-12 DIAGNOSIS — R300 Dysuria: Secondary | ICD-10-CM | POA: Diagnosis not present

## 2016-02-23 DIAGNOSIS — Z86010 Personal history of colonic polyps: Secondary | ICD-10-CM | POA: Diagnosis not present

## 2016-02-23 DIAGNOSIS — Z1211 Encounter for screening for malignant neoplasm of colon: Secondary | ICD-10-CM | POA: Diagnosis not present

## 2016-02-25 DIAGNOSIS — H26492 Other secondary cataract, left eye: Secondary | ICD-10-CM | POA: Diagnosis not present

## 2016-03-04 DIAGNOSIS — E785 Hyperlipidemia, unspecified: Secondary | ICD-10-CM | POA: Diagnosis not present

## 2016-03-04 DIAGNOSIS — Z136 Encounter for screening for cardiovascular disorders: Secondary | ICD-10-CM | POA: Diagnosis not present

## 2016-03-04 DIAGNOSIS — E1122 Type 2 diabetes mellitus with diabetic chronic kidney disease: Secondary | ICD-10-CM | POA: Diagnosis not present

## 2016-03-04 DIAGNOSIS — Z23 Encounter for immunization: Secondary | ICD-10-CM | POA: Diagnosis not present

## 2016-03-04 DIAGNOSIS — N183 Chronic kidney disease, stage 3 (moderate): Secondary | ICD-10-CM | POA: Diagnosis not present

## 2016-03-04 DIAGNOSIS — I129 Hypertensive chronic kidney disease with stage 1 through stage 4 chronic kidney disease, or unspecified chronic kidney disease: Secondary | ICD-10-CM | POA: Diagnosis not present

## 2016-03-04 DIAGNOSIS — E039 Hypothyroidism, unspecified: Secondary | ICD-10-CM | POA: Diagnosis not present

## 2016-03-04 DIAGNOSIS — Z0000 Encounter for general adult medical examination without abnormal findings: Secondary | ICD-10-CM | POA: Diagnosis not present

## 2016-03-04 DIAGNOSIS — E559 Vitamin D deficiency, unspecified: Secondary | ICD-10-CM | POA: Diagnosis not present

## 2016-03-09 DIAGNOSIS — E1122 Type 2 diabetes mellitus with diabetic chronic kidney disease: Secondary | ICD-10-CM | POA: Diagnosis not present

## 2016-03-09 DIAGNOSIS — E559 Vitamin D deficiency, unspecified: Secondary | ICD-10-CM | POA: Diagnosis not present

## 2016-03-09 DIAGNOSIS — E039 Hypothyroidism, unspecified: Secondary | ICD-10-CM | POA: Diagnosis not present

## 2016-03-09 DIAGNOSIS — M109 Gout, unspecified: Secondary | ICD-10-CM | POA: Diagnosis not present

## 2016-03-09 DIAGNOSIS — N183 Chronic kidney disease, stage 3 (moderate): Secondary | ICD-10-CM | POA: Diagnosis not present

## 2016-03-09 DIAGNOSIS — E785 Hyperlipidemia, unspecified: Secondary | ICD-10-CM | POA: Diagnosis not present

## 2016-04-18 DIAGNOSIS — N959 Unspecified menopausal and perimenopausal disorder: Secondary | ICD-10-CM | POA: Diagnosis not present

## 2016-04-18 DIAGNOSIS — M8580 Other specified disorders of bone density and structure, unspecified site: Secondary | ICD-10-CM | POA: Diagnosis not present

## 2016-04-18 DIAGNOSIS — Z1231 Encounter for screening mammogram for malignant neoplasm of breast: Secondary | ICD-10-CM | POA: Diagnosis not present

## 2016-04-18 DIAGNOSIS — M85832 Other specified disorders of bone density and structure, left forearm: Secondary | ICD-10-CM | POA: Diagnosis not present

## 2016-04-20 DIAGNOSIS — E119 Type 2 diabetes mellitus without complications: Secondary | ICD-10-CM | POA: Diagnosis not present

## 2016-05-02 DIAGNOSIS — M25551 Pain in right hip: Secondary | ICD-10-CM | POA: Diagnosis not present

## 2016-05-02 DIAGNOSIS — J22 Unspecified acute lower respiratory infection: Secondary | ICD-10-CM | POA: Diagnosis not present

## 2016-05-04 DIAGNOSIS — J189 Pneumonia, unspecified organism: Secondary | ICD-10-CM | POA: Diagnosis not present

## 2016-05-04 DIAGNOSIS — R05 Cough: Secondary | ICD-10-CM | POA: Diagnosis not present

## 2016-05-04 DIAGNOSIS — J22 Unspecified acute lower respiratory infection: Secondary | ICD-10-CM | POA: Diagnosis not present

## 2016-05-06 DIAGNOSIS — J189 Pneumonia, unspecified organism: Secondary | ICD-10-CM | POA: Diagnosis not present

## 2016-05-12 DIAGNOSIS — J069 Acute upper respiratory infection, unspecified: Secondary | ICD-10-CM | POA: Diagnosis not present

## 2016-05-25 DIAGNOSIS — D120 Benign neoplasm of cecum: Secondary | ICD-10-CM | POA: Diagnosis not present

## 2016-05-25 DIAGNOSIS — E119 Type 2 diabetes mellitus without complications: Secondary | ICD-10-CM | POA: Diagnosis not present

## 2016-05-25 DIAGNOSIS — Z885 Allergy status to narcotic agent status: Secondary | ICD-10-CM | POA: Diagnosis not present

## 2016-05-25 DIAGNOSIS — Z888 Allergy status to other drugs, medicaments and biological substances status: Secondary | ICD-10-CM | POA: Diagnosis not present

## 2016-05-25 DIAGNOSIS — K219 Gastro-esophageal reflux disease without esophagitis: Secondary | ICD-10-CM | POA: Diagnosis not present

## 2016-05-25 DIAGNOSIS — D122 Benign neoplasm of ascending colon: Secondary | ICD-10-CM | POA: Diagnosis not present

## 2016-05-25 DIAGNOSIS — Z7982 Long term (current) use of aspirin: Secondary | ICD-10-CM | POA: Diagnosis not present

## 2016-05-25 DIAGNOSIS — Z1211 Encounter for screening for malignant neoplasm of colon: Secondary | ICD-10-CM | POA: Diagnosis not present

## 2016-05-25 DIAGNOSIS — Z86010 Personal history of colonic polyps: Secondary | ICD-10-CM | POA: Diagnosis not present

## 2016-05-25 DIAGNOSIS — E039 Hypothyroidism, unspecified: Secondary | ICD-10-CM | POA: Diagnosis not present

## 2016-05-25 DIAGNOSIS — Z881 Allergy status to other antibiotic agents status: Secondary | ICD-10-CM | POA: Diagnosis not present

## 2016-05-25 DIAGNOSIS — M8580 Other specified disorders of bone density and structure, unspecified site: Secondary | ICD-10-CM | POA: Diagnosis not present

## 2016-05-25 DIAGNOSIS — M1990 Unspecified osteoarthritis, unspecified site: Secondary | ICD-10-CM | POA: Diagnosis not present

## 2016-05-25 DIAGNOSIS — Z87891 Personal history of nicotine dependence: Secondary | ICD-10-CM | POA: Diagnosis not present

## 2016-05-25 DIAGNOSIS — K5730 Diverticulosis of large intestine without perforation or abscess without bleeding: Secondary | ICD-10-CM | POA: Diagnosis not present

## 2016-05-25 DIAGNOSIS — Z79899 Other long term (current) drug therapy: Secondary | ICD-10-CM | POA: Diagnosis not present

## 2016-05-25 DIAGNOSIS — Z880 Allergy status to penicillin: Secondary | ICD-10-CM | POA: Diagnosis not present

## 2016-06-06 DIAGNOSIS — M25551 Pain in right hip: Secondary | ICD-10-CM | POA: Diagnosis not present

## 2016-06-06 DIAGNOSIS — J189 Pneumonia, unspecified organism: Secondary | ICD-10-CM | POA: Diagnosis not present

## 2016-06-07 DIAGNOSIS — R05 Cough: Secondary | ICD-10-CM | POA: Diagnosis not present

## 2016-06-07 DIAGNOSIS — R0602 Shortness of breath: Secondary | ICD-10-CM | POA: Diagnosis not present

## 2016-06-07 DIAGNOSIS — J189 Pneumonia, unspecified organism: Secondary | ICD-10-CM | POA: Diagnosis not present

## 2016-06-08 DIAGNOSIS — H40013 Open angle with borderline findings, low risk, bilateral: Secondary | ICD-10-CM | POA: Diagnosis not present

## 2016-06-16 DIAGNOSIS — M7061 Trochanteric bursitis, right hip: Secondary | ICD-10-CM | POA: Diagnosis not present

## 2016-06-23 DIAGNOSIS — M6281 Muscle weakness (generalized): Secondary | ICD-10-CM | POA: Diagnosis not present

## 2016-06-23 DIAGNOSIS — M7061 Trochanteric bursitis, right hip: Secondary | ICD-10-CM | POA: Diagnosis not present

## 2016-06-23 DIAGNOSIS — M25651 Stiffness of right hip, not elsewhere classified: Secondary | ICD-10-CM | POA: Diagnosis not present

## 2016-06-23 DIAGNOSIS — M25551 Pain in right hip: Secondary | ICD-10-CM | POA: Diagnosis not present

## 2016-06-28 DIAGNOSIS — M25651 Stiffness of right hip, not elsewhere classified: Secondary | ICD-10-CM | POA: Diagnosis not present

## 2016-06-28 DIAGNOSIS — M6281 Muscle weakness (generalized): Secondary | ICD-10-CM | POA: Diagnosis not present

## 2016-06-28 DIAGNOSIS — M7061 Trochanteric bursitis, right hip: Secondary | ICD-10-CM | POA: Diagnosis not present

## 2016-06-28 DIAGNOSIS — M25551 Pain in right hip: Secondary | ICD-10-CM | POA: Diagnosis not present

## 2016-06-30 DIAGNOSIS — M6281 Muscle weakness (generalized): Secondary | ICD-10-CM | POA: Diagnosis not present

## 2016-06-30 DIAGNOSIS — M7061 Trochanteric bursitis, right hip: Secondary | ICD-10-CM | POA: Diagnosis not present

## 2016-06-30 DIAGNOSIS — M25551 Pain in right hip: Secondary | ICD-10-CM | POA: Diagnosis not present

## 2016-06-30 DIAGNOSIS — D0359 Melanoma in situ of other part of trunk: Secondary | ICD-10-CM | POA: Diagnosis not present

## 2016-06-30 DIAGNOSIS — M25651 Stiffness of right hip, not elsewhere classified: Secondary | ICD-10-CM | POA: Diagnosis not present

## 2016-06-30 DIAGNOSIS — N183 Chronic kidney disease, stage 3 (moderate): Secondary | ICD-10-CM | POA: Diagnosis not present

## 2016-07-05 DIAGNOSIS — M7061 Trochanteric bursitis, right hip: Secondary | ICD-10-CM | POA: Diagnosis not present

## 2016-07-05 DIAGNOSIS — M25551 Pain in right hip: Secondary | ICD-10-CM | POA: Diagnosis not present

## 2016-07-05 DIAGNOSIS — M6281 Muscle weakness (generalized): Secondary | ICD-10-CM | POA: Diagnosis not present

## 2016-07-05 DIAGNOSIS — M25651 Stiffness of right hip, not elsewhere classified: Secondary | ICD-10-CM | POA: Diagnosis not present

## 2016-07-07 DIAGNOSIS — M25551 Pain in right hip: Secondary | ICD-10-CM | POA: Diagnosis not present

## 2016-07-07 DIAGNOSIS — I129 Hypertensive chronic kidney disease with stage 1 through stage 4 chronic kidney disease, or unspecified chronic kidney disease: Secondary | ICD-10-CM | POA: Diagnosis not present

## 2016-07-07 DIAGNOSIS — M7061 Trochanteric bursitis, right hip: Secondary | ICD-10-CM | POA: Diagnosis not present

## 2016-07-07 DIAGNOSIS — M9080 Osteopathy in diseases classified elsewhere, unspecified site: Secondary | ICD-10-CM | POA: Diagnosis not present

## 2016-07-07 DIAGNOSIS — E559 Vitamin D deficiency, unspecified: Secondary | ICD-10-CM | POA: Diagnosis not present

## 2016-07-07 DIAGNOSIS — N183 Chronic kidney disease, stage 3 (moderate): Secondary | ICD-10-CM | POA: Diagnosis not present

## 2016-07-07 DIAGNOSIS — M6281 Muscle weakness (generalized): Secondary | ICD-10-CM | POA: Diagnosis not present

## 2016-07-07 DIAGNOSIS — E889 Metabolic disorder, unspecified: Secondary | ICD-10-CM | POA: Diagnosis not present

## 2016-07-07 DIAGNOSIS — M25651 Stiffness of right hip, not elsewhere classified: Secondary | ICD-10-CM | POA: Diagnosis not present

## 2016-07-12 DIAGNOSIS — M6281 Muscle weakness (generalized): Secondary | ICD-10-CM | POA: Diagnosis not present

## 2016-07-12 DIAGNOSIS — M25651 Stiffness of right hip, not elsewhere classified: Secondary | ICD-10-CM | POA: Diagnosis not present

## 2016-07-12 DIAGNOSIS — M7061 Trochanteric bursitis, right hip: Secondary | ICD-10-CM | POA: Diagnosis not present

## 2016-07-12 DIAGNOSIS — M25551 Pain in right hip: Secondary | ICD-10-CM | POA: Diagnosis not present

## 2016-07-13 DIAGNOSIS — E1122 Type 2 diabetes mellitus with diabetic chronic kidney disease: Secondary | ICD-10-CM | POA: Diagnosis not present

## 2016-07-13 DIAGNOSIS — E785 Hyperlipidemia, unspecified: Secondary | ICD-10-CM | POA: Diagnosis not present

## 2016-07-13 DIAGNOSIS — M1990 Unspecified osteoarthritis, unspecified site: Secondary | ICD-10-CM | POA: Diagnosis not present

## 2016-07-13 DIAGNOSIS — M109 Gout, unspecified: Secondary | ICD-10-CM | POA: Diagnosis not present

## 2016-07-13 DIAGNOSIS — E559 Vitamin D deficiency, unspecified: Secondary | ICD-10-CM | POA: Diagnosis not present

## 2016-07-13 DIAGNOSIS — N183 Chronic kidney disease, stage 3 (moderate): Secondary | ICD-10-CM | POA: Diagnosis not present

## 2016-07-13 DIAGNOSIS — E039 Hypothyroidism, unspecified: Secondary | ICD-10-CM | POA: Diagnosis not present

## 2016-07-13 DIAGNOSIS — Z79899 Other long term (current) drug therapy: Secondary | ICD-10-CM | POA: Diagnosis not present

## 2016-07-13 DIAGNOSIS — I129 Hypertensive chronic kidney disease with stage 1 through stage 4 chronic kidney disease, or unspecified chronic kidney disease: Secondary | ICD-10-CM | POA: Diagnosis not present

## 2016-07-14 DIAGNOSIS — M25551 Pain in right hip: Secondary | ICD-10-CM | POA: Diagnosis not present

## 2016-07-14 DIAGNOSIS — M25651 Stiffness of right hip, not elsewhere classified: Secondary | ICD-10-CM | POA: Diagnosis not present

## 2016-07-14 DIAGNOSIS — M7061 Trochanteric bursitis, right hip: Secondary | ICD-10-CM | POA: Diagnosis not present

## 2016-07-14 DIAGNOSIS — M6281 Muscle weakness (generalized): Secondary | ICD-10-CM | POA: Diagnosis not present

## 2016-07-19 DIAGNOSIS — M25651 Stiffness of right hip, not elsewhere classified: Secondary | ICD-10-CM | POA: Diagnosis not present

## 2016-07-19 DIAGNOSIS — M7061 Trochanteric bursitis, right hip: Secondary | ICD-10-CM | POA: Diagnosis not present

## 2016-07-19 DIAGNOSIS — M6281 Muscle weakness (generalized): Secondary | ICD-10-CM | POA: Diagnosis not present

## 2016-07-19 DIAGNOSIS — M25551 Pain in right hip: Secondary | ICD-10-CM | POA: Diagnosis not present

## 2016-07-21 DIAGNOSIS — M7061 Trochanteric bursitis, right hip: Secondary | ICD-10-CM | POA: Diagnosis not present

## 2016-07-21 DIAGNOSIS — M25551 Pain in right hip: Secondary | ICD-10-CM | POA: Diagnosis not present

## 2016-07-21 DIAGNOSIS — M25651 Stiffness of right hip, not elsewhere classified: Secondary | ICD-10-CM | POA: Diagnosis not present

## 2016-07-21 DIAGNOSIS — M6281 Muscle weakness (generalized): Secondary | ICD-10-CM | POA: Diagnosis not present

## 2016-07-26 DIAGNOSIS — M7061 Trochanteric bursitis, right hip: Secondary | ICD-10-CM | POA: Diagnosis not present

## 2016-07-26 DIAGNOSIS — M6281 Muscle weakness (generalized): Secondary | ICD-10-CM | POA: Diagnosis not present

## 2016-07-26 DIAGNOSIS — M25551 Pain in right hip: Secondary | ICD-10-CM | POA: Diagnosis not present

## 2016-07-26 DIAGNOSIS — M25651 Stiffness of right hip, not elsewhere classified: Secondary | ICD-10-CM | POA: Diagnosis not present

## 2016-07-28 DIAGNOSIS — M6281 Muscle weakness (generalized): Secondary | ICD-10-CM | POA: Diagnosis not present

## 2016-07-28 DIAGNOSIS — M25551 Pain in right hip: Secondary | ICD-10-CM | POA: Diagnosis not present

## 2016-07-28 DIAGNOSIS — M7061 Trochanteric bursitis, right hip: Secondary | ICD-10-CM | POA: Diagnosis not present

## 2016-07-28 DIAGNOSIS — M461 Sacroiliitis, not elsewhere classified: Secondary | ICD-10-CM | POA: Diagnosis not present

## 2016-07-28 DIAGNOSIS — M25651 Stiffness of right hip, not elsewhere classified: Secondary | ICD-10-CM | POA: Diagnosis not present

## 2016-08-01 DIAGNOSIS — M7061 Trochanteric bursitis, right hip: Secondary | ICD-10-CM | POA: Diagnosis not present

## 2016-08-01 DIAGNOSIS — M545 Low back pain: Secondary | ICD-10-CM | POA: Diagnosis not present

## 2016-08-22 DIAGNOSIS — M7061 Trochanteric bursitis, right hip: Secondary | ICD-10-CM | POA: Diagnosis not present

## 2016-08-22 DIAGNOSIS — M5416 Radiculopathy, lumbar region: Secondary | ICD-10-CM | POA: Diagnosis not present

## 2016-08-22 DIAGNOSIS — M461 Sacroiliitis, not elsewhere classified: Secondary | ICD-10-CM | POA: Diagnosis not present

## 2016-08-29 DIAGNOSIS — R233 Spontaneous ecchymoses: Secondary | ICD-10-CM | POA: Diagnosis not present

## 2016-08-29 DIAGNOSIS — L720 Epidermal cyst: Secondary | ICD-10-CM | POA: Diagnosis not present

## 2016-08-30 DIAGNOSIS — M461 Sacroiliitis, not elsewhere classified: Secondary | ICD-10-CM | POA: Diagnosis not present

## 2016-08-30 DIAGNOSIS — M5416 Radiculopathy, lumbar region: Secondary | ICD-10-CM | POA: Diagnosis not present

## 2016-08-30 DIAGNOSIS — M545 Low back pain: Secondary | ICD-10-CM | POA: Diagnosis not present

## 2016-09-07 DIAGNOSIS — M5416 Radiculopathy, lumbar region: Secondary | ICD-10-CM | POA: Diagnosis not present

## 2016-09-16 DIAGNOSIS — M48061 Spinal stenosis, lumbar region without neurogenic claudication: Secondary | ICD-10-CM | POA: Diagnosis not present

## 2016-09-16 DIAGNOSIS — M48062 Spinal stenosis, lumbar region with neurogenic claudication: Secondary | ICD-10-CM | POA: Diagnosis not present

## 2016-09-16 DIAGNOSIS — M5416 Radiculopathy, lumbar region: Secondary | ICD-10-CM | POA: Diagnosis not present

## 2016-09-28 DIAGNOSIS — N390 Urinary tract infection, site not specified: Secondary | ICD-10-CM | POA: Diagnosis not present

## 2016-09-28 DIAGNOSIS — R300 Dysuria: Secondary | ICD-10-CM | POA: Diagnosis not present

## 2016-10-04 DIAGNOSIS — H43811 Vitreous degeneration, right eye: Secondary | ICD-10-CM | POA: Diagnosis not present

## 2016-10-04 DIAGNOSIS — H40013 Open angle with borderline findings, low risk, bilateral: Secondary | ICD-10-CM | POA: Diagnosis not present

## 2016-10-04 DIAGNOSIS — Z961 Presence of intraocular lens: Secondary | ICD-10-CM | POA: Diagnosis not present

## 2016-10-04 DIAGNOSIS — H04123 Dry eye syndrome of bilateral lacrimal glands: Secondary | ICD-10-CM | POA: Diagnosis not present

## 2016-10-12 DIAGNOSIS — N390 Urinary tract infection, site not specified: Secondary | ICD-10-CM | POA: Diagnosis not present

## 2016-10-31 DIAGNOSIS — L309 Dermatitis, unspecified: Secondary | ICD-10-CM | POA: Diagnosis not present

## 2016-12-15 DIAGNOSIS — M5417 Radiculopathy, lumbosacral region: Secondary | ICD-10-CM | POA: Diagnosis not present

## 2016-12-15 DIAGNOSIS — M9903 Segmental and somatic dysfunction of lumbar region: Secondary | ICD-10-CM | POA: Diagnosis not present

## 2016-12-16 DIAGNOSIS — M9903 Segmental and somatic dysfunction of lumbar region: Secondary | ICD-10-CM | POA: Diagnosis not present

## 2016-12-16 DIAGNOSIS — M5417 Radiculopathy, lumbosacral region: Secondary | ICD-10-CM | POA: Diagnosis not present

## 2016-12-19 DIAGNOSIS — M5417 Radiculopathy, lumbosacral region: Secondary | ICD-10-CM | POA: Diagnosis not present

## 2016-12-19 DIAGNOSIS — M9903 Segmental and somatic dysfunction of lumbar region: Secondary | ICD-10-CM | POA: Diagnosis not present

## 2016-12-27 DIAGNOSIS — E785 Hyperlipidemia, unspecified: Secondary | ICD-10-CM | POA: Diagnosis not present

## 2016-12-27 DIAGNOSIS — E559 Vitamin D deficiency, unspecified: Secondary | ICD-10-CM | POA: Diagnosis not present

## 2016-12-27 DIAGNOSIS — N183 Chronic kidney disease, stage 3 (moderate): Secondary | ICD-10-CM | POA: Diagnosis not present

## 2016-12-27 DIAGNOSIS — M461 Sacroiliitis, not elsewhere classified: Secondary | ICD-10-CM | POA: Diagnosis not present

## 2016-12-27 DIAGNOSIS — I129 Hypertensive chronic kidney disease with stage 1 through stage 4 chronic kidney disease, or unspecified chronic kidney disease: Secondary | ICD-10-CM | POA: Diagnosis not present

## 2016-12-27 DIAGNOSIS — E1122 Type 2 diabetes mellitus with diabetic chronic kidney disease: Secondary | ICD-10-CM | POA: Diagnosis not present

## 2016-12-27 DIAGNOSIS — Z79899 Other long term (current) drug therapy: Secondary | ICD-10-CM | POA: Diagnosis not present

## 2016-12-27 DIAGNOSIS — E039 Hypothyroidism, unspecified: Secondary | ICD-10-CM | POA: Diagnosis not present

## 2016-12-27 DIAGNOSIS — M109 Gout, unspecified: Secondary | ICD-10-CM | POA: Diagnosis not present

## 2016-12-28 DIAGNOSIS — N183 Chronic kidney disease, stage 3 (moderate): Secondary | ICD-10-CM | POA: Diagnosis not present

## 2016-12-29 DIAGNOSIS — Z85820 Personal history of malignant melanoma of skin: Secondary | ICD-10-CM | POA: Diagnosis not present

## 2016-12-29 DIAGNOSIS — D1801 Hemangioma of skin and subcutaneous tissue: Secondary | ICD-10-CM | POA: Diagnosis not present

## 2016-12-29 DIAGNOSIS — L821 Other seborrheic keratosis: Secondary | ICD-10-CM | POA: Diagnosis not present

## 2017-01-03 DIAGNOSIS — M62551 Muscle wasting and atrophy, not elsewhere classified, right thigh: Secondary | ICD-10-CM | POA: Diagnosis not present

## 2017-01-03 DIAGNOSIS — M461 Sacroiliitis, not elsewhere classified: Secondary | ICD-10-CM | POA: Diagnosis not present

## 2017-01-03 DIAGNOSIS — M545 Low back pain: Secondary | ICD-10-CM | POA: Diagnosis not present

## 2017-01-05 DIAGNOSIS — I129 Hypertensive chronic kidney disease with stage 1 through stage 4 chronic kidney disease, or unspecified chronic kidney disease: Secondary | ICD-10-CM | POA: Diagnosis not present

## 2017-01-05 DIAGNOSIS — M9080 Osteopathy in diseases classified elsewhere, unspecified site: Secondary | ICD-10-CM | POA: Diagnosis not present

## 2017-01-05 DIAGNOSIS — E559 Vitamin D deficiency, unspecified: Secondary | ICD-10-CM | POA: Diagnosis not present

## 2017-01-05 DIAGNOSIS — E889 Metabolic disorder, unspecified: Secondary | ICD-10-CM | POA: Diagnosis not present

## 2017-01-05 DIAGNOSIS — N183 Chronic kidney disease, stage 3 (moderate): Secondary | ICD-10-CM | POA: Diagnosis not present

## 2017-01-06 DIAGNOSIS — M545 Low back pain: Secondary | ICD-10-CM | POA: Diagnosis not present

## 2017-01-06 DIAGNOSIS — M62551 Muscle wasting and atrophy, not elsewhere classified, right thigh: Secondary | ICD-10-CM | POA: Diagnosis not present

## 2017-01-06 DIAGNOSIS — M461 Sacroiliitis, not elsewhere classified: Secondary | ICD-10-CM | POA: Diagnosis not present

## 2017-01-10 DIAGNOSIS — M545 Low back pain: Secondary | ICD-10-CM | POA: Diagnosis not present

## 2017-01-10 DIAGNOSIS — M62551 Muscle wasting and atrophy, not elsewhere classified, right thigh: Secondary | ICD-10-CM | POA: Diagnosis not present

## 2017-01-10 DIAGNOSIS — M461 Sacroiliitis, not elsewhere classified: Secondary | ICD-10-CM | POA: Diagnosis not present

## 2017-01-12 DIAGNOSIS — M545 Low back pain: Secondary | ICD-10-CM | POA: Diagnosis not present

## 2017-01-12 DIAGNOSIS — M461 Sacroiliitis, not elsewhere classified: Secondary | ICD-10-CM | POA: Diagnosis not present

## 2017-01-12 DIAGNOSIS — M62551 Muscle wasting and atrophy, not elsewhere classified, right thigh: Secondary | ICD-10-CM | POA: Diagnosis not present

## 2017-01-18 DIAGNOSIS — M62551 Muscle wasting and atrophy, not elsewhere classified, right thigh: Secondary | ICD-10-CM | POA: Diagnosis not present

## 2017-01-18 DIAGNOSIS — M545 Low back pain: Secondary | ICD-10-CM | POA: Diagnosis not present

## 2017-01-18 DIAGNOSIS — M461 Sacroiliitis, not elsewhere classified: Secondary | ICD-10-CM | POA: Diagnosis not present

## 2017-01-19 DIAGNOSIS — M4317 Spondylolisthesis, lumbosacral region: Secondary | ICD-10-CM | POA: Diagnosis not present

## 2017-01-19 DIAGNOSIS — M4727 Other spondylosis with radiculopathy, lumbosacral region: Secondary | ICD-10-CM | POA: Diagnosis not present

## 2017-01-20 DIAGNOSIS — M545 Low back pain: Secondary | ICD-10-CM | POA: Diagnosis not present

## 2017-01-20 DIAGNOSIS — M461 Sacroiliitis, not elsewhere classified: Secondary | ICD-10-CM | POA: Diagnosis not present

## 2017-01-20 DIAGNOSIS — M62551 Muscle wasting and atrophy, not elsewhere classified, right thigh: Secondary | ICD-10-CM | POA: Diagnosis not present

## 2017-01-23 ENCOUNTER — Other Ambulatory Visit: Payer: Self-pay | Admitting: Neurological Surgery

## 2017-01-24 DIAGNOSIS — M545 Low back pain: Secondary | ICD-10-CM | POA: Diagnosis not present

## 2017-01-24 DIAGNOSIS — M461 Sacroiliitis, not elsewhere classified: Secondary | ICD-10-CM | POA: Diagnosis not present

## 2017-01-24 DIAGNOSIS — M62551 Muscle wasting and atrophy, not elsewhere classified, right thigh: Secondary | ICD-10-CM | POA: Diagnosis not present

## 2017-01-26 DIAGNOSIS — M62551 Muscle wasting and atrophy, not elsewhere classified, right thigh: Secondary | ICD-10-CM | POA: Diagnosis not present

## 2017-01-26 DIAGNOSIS — M461 Sacroiliitis, not elsewhere classified: Secondary | ICD-10-CM | POA: Diagnosis not present

## 2017-01-26 DIAGNOSIS — M545 Low back pain: Secondary | ICD-10-CM | POA: Diagnosis not present

## 2017-01-31 DIAGNOSIS — M545 Low back pain: Secondary | ICD-10-CM | POA: Diagnosis not present

## 2017-01-31 DIAGNOSIS — M62551 Muscle wasting and atrophy, not elsewhere classified, right thigh: Secondary | ICD-10-CM | POA: Diagnosis not present

## 2017-01-31 DIAGNOSIS — M461 Sacroiliitis, not elsewhere classified: Secondary | ICD-10-CM | POA: Diagnosis not present

## 2017-02-01 ENCOUNTER — Other Ambulatory Visit (HOSPITAL_COMMUNITY): Payer: Self-pay | Admitting: Neurological Surgery

## 2017-02-01 DIAGNOSIS — M4727 Other spondylosis with radiculopathy, lumbosacral region: Secondary | ICD-10-CM

## 2017-02-02 DIAGNOSIS — M545 Low back pain: Secondary | ICD-10-CM | POA: Diagnosis not present

## 2017-02-02 DIAGNOSIS — M461 Sacroiliitis, not elsewhere classified: Secondary | ICD-10-CM | POA: Diagnosis not present

## 2017-02-02 DIAGNOSIS — M62551 Muscle wasting and atrophy, not elsewhere classified, right thigh: Secondary | ICD-10-CM | POA: Diagnosis not present

## 2017-02-07 DIAGNOSIS — M461 Sacroiliitis, not elsewhere classified: Secondary | ICD-10-CM | POA: Diagnosis not present

## 2017-02-07 DIAGNOSIS — M545 Low back pain: Secondary | ICD-10-CM | POA: Diagnosis not present

## 2017-02-07 DIAGNOSIS — M62551 Muscle wasting and atrophy, not elsewhere classified, right thigh: Secondary | ICD-10-CM | POA: Diagnosis not present

## 2017-02-09 DIAGNOSIS — M62551 Muscle wasting and atrophy, not elsewhere classified, right thigh: Secondary | ICD-10-CM | POA: Diagnosis not present

## 2017-02-09 DIAGNOSIS — M461 Sacroiliitis, not elsewhere classified: Secondary | ICD-10-CM | POA: Diagnosis not present

## 2017-02-09 DIAGNOSIS — M545 Low back pain: Secondary | ICD-10-CM | POA: Diagnosis not present

## 2017-02-14 DIAGNOSIS — M545 Low back pain: Secondary | ICD-10-CM | POA: Diagnosis not present

## 2017-02-14 DIAGNOSIS — M62551 Muscle wasting and atrophy, not elsewhere classified, right thigh: Secondary | ICD-10-CM | POA: Diagnosis not present

## 2017-02-14 DIAGNOSIS — M461 Sacroiliitis, not elsewhere classified: Secondary | ICD-10-CM | POA: Diagnosis not present

## 2017-02-15 DIAGNOSIS — Z23 Encounter for immunization: Secondary | ICD-10-CM | POA: Diagnosis not present

## 2017-02-16 DIAGNOSIS — M62551 Muscle wasting and atrophy, not elsewhere classified, right thigh: Secondary | ICD-10-CM | POA: Diagnosis not present

## 2017-02-16 DIAGNOSIS — M545 Low back pain: Secondary | ICD-10-CM | POA: Diagnosis not present

## 2017-02-16 DIAGNOSIS — M461 Sacroiliitis, not elsewhere classified: Secondary | ICD-10-CM | POA: Diagnosis not present

## 2017-02-20 DIAGNOSIS — J22 Unspecified acute lower respiratory infection: Secondary | ICD-10-CM | POA: Diagnosis not present

## 2017-02-21 DIAGNOSIS — M62551 Muscle wasting and atrophy, not elsewhere classified, right thigh: Secondary | ICD-10-CM | POA: Diagnosis not present

## 2017-02-21 DIAGNOSIS — M461 Sacroiliitis, not elsewhere classified: Secondary | ICD-10-CM | POA: Diagnosis not present

## 2017-02-21 DIAGNOSIS — M545 Low back pain: Secondary | ICD-10-CM | POA: Diagnosis not present

## 2017-02-23 DIAGNOSIS — M461 Sacroiliitis, not elsewhere classified: Secondary | ICD-10-CM | POA: Diagnosis not present

## 2017-02-23 DIAGNOSIS — M62551 Muscle wasting and atrophy, not elsewhere classified, right thigh: Secondary | ICD-10-CM | POA: Diagnosis not present

## 2017-02-23 DIAGNOSIS — M545 Low back pain: Secondary | ICD-10-CM | POA: Diagnosis not present

## 2017-03-10 NOTE — Pre-Procedure Instructions (Signed)
    Amanda Warner  03/10/2017      [facility] [place], [place] [address] [place] [address] Phone: [phone] Fax: [phone]  [place], [place] [address] [place] [address] Phone: [phone] Fax: [phone]    Your procedure is scheduled on 03/20/17.  Report to [facility] Admitting at 530 A.M.  Call this number if you have problems the morning of surgery:  [phone]   Remember:  Do not eat food or drink liquids after midnight.  Take these medicines the morning of surgery with A SIP OF WATER ---tylenol,neurontin,synthroid   Do not wear jewelry, make-up or nail polish.  Do not wear lotions, powders, or perfumes, or deoderant.  Do not shave 48 hours prior to surgery.  Men may shave face and neck.  Do not bring valuables to the hospital.  [facility] is not responsible for any belongings or valuables.  Contacts, dentures or bridgework may not be worn into surgery.  Leave your suitcase in the car.  After surgery it may be brought to your room.  For patients admitted to the hospital, discharge time will be determined by your treatment team.  Patients discharged the day of surgery will not be allowed to drive home.   Name and phone number of your driver:    Special instructions:  Do not take any aspirin,anti-inflammatories,vitamins,or herbal supplements 5-7 days prior to surgery.  Please read over the following fact sheets that you were given. MRSA Information

## 2017-03-13 ENCOUNTER — Encounter (HOSPITAL_COMMUNITY): Payer: Self-pay

## 2017-03-13 ENCOUNTER — Ambulatory Visit (HOSPITAL_COMMUNITY)
Admission: RE | Admit: 2017-03-13 | Discharge: 2017-03-13 | Disposition: A | Discharge status: Home / Self Care | Payer: Medicare Other | Source: Ambulatory Visit | Attending: Neurological Surgery | Admitting: Neurological Surgery

## 2017-03-13 ENCOUNTER — Encounter (HOSPITAL_COMMUNITY)
Admission: RE | Admit: 2017-03-13 | Discharge: 2017-03-13 | Disposition: A | Discharge status: Home / Self Care | Payer: Medicare Other | Source: Ambulatory Visit | Attending: Neurological Surgery | Admitting: Neurological Surgery

## 2017-03-13 DIAGNOSIS — M5136 Other intervertebral disc degeneration, lumbar region: Secondary | ICD-10-CM | POA: Diagnosis not present

## 2017-03-13 DIAGNOSIS — I10 Essential (primary) hypertension: Secondary | ICD-10-CM | POA: Diagnosis not present

## 2017-03-13 DIAGNOSIS — I700 Atherosclerosis of aorta: Secondary | ICD-10-CM | POA: Insufficient documentation

## 2017-03-13 DIAGNOSIS — M4727 Other spondylosis with radiculopathy, lumbosacral region: Secondary | ICD-10-CM

## 2017-03-13 DIAGNOSIS — M48061 Spinal stenosis, lumbar region without neurogenic claudication: Secondary | ICD-10-CM | POA: Diagnosis not present

## 2017-03-13 DIAGNOSIS — M4316 Spondylolisthesis, lumbar region: Secondary | ICD-10-CM | POA: Diagnosis not present

## 2017-03-13 DIAGNOSIS — Z79899 Other long term (current) drug therapy: Secondary | ICD-10-CM | POA: Insufficient documentation

## 2017-03-13 DIAGNOSIS — Z01818 Encounter for other preprocedural examination: Secondary | ICD-10-CM | POA: Insufficient documentation

## 2017-03-13 HISTORY — DX: Dyspnea, unspecified: R06.00

## 2017-03-13 HISTORY — DX: Pneumonia, unspecified organism: J18.9

## 2017-03-13 HISTORY — DX: Chronic kidney disease, unspecified: N18.9

## 2017-03-13 HISTORY — DX: Edema, unspecified: R60.9

## 2017-03-13 HISTORY — DX: Gout, unspecified: M10.9

## 2017-03-13 LAB — CBC
HEMATOCRIT: 37.4 % (ref 36.0–46.0)
HEMOGLOBIN: 11.7 g/dL — AB (ref 12.0–15.0)
MCH: 26.8 pg (ref 26.0–34.0)
MCHC: 31.3 g/dL (ref 30.0–36.0)
MCV: 85.8 fL (ref 78.0–100.0)
Platelets: 155 10*3/uL (ref 150–400)
RBC: 4.36 MIL/uL (ref 3.87–5.11)
RDW: 14.8 % (ref 11.5–15.5)
WBC: 5.1 10*3/uL (ref 4.0–10.5)

## 2017-03-13 LAB — SURGICAL PCR SCREEN
MRSA, PCR: NEGATIVE
STAPHYLOCOCCUS AUREUS: NEGATIVE

## 2017-03-13 LAB — HEMOGLOBIN A1C
HEMOGLOBIN A1C: 7 % — AB (ref 4.8–5.6)
Mean Plasma Glucose: 154.2 mg/dL

## 2017-03-13 LAB — BASIC METABOLIC PANEL
ANION GAP: 9 (ref 5–15)
BUN: 23 mg/dL — ABNORMAL HIGH (ref 6–20)
CALCIUM: 9.8 mg/dL (ref 8.9–10.3)
CHLORIDE: 105 mmol/L (ref 101–111)
CO2: 26 mmol/L (ref 22–32)
CREATININE: 1.16 mg/dL — AB (ref 0.44–1.00)
GFR calc non Af Amer: 44 mL/min — ABNORMAL LOW (ref >60)
GFR, EST AFRICAN AMERICAN: 51 mL/min — AB (ref >60)
Glucose, Bld: 145 mg/dL — ABNORMAL HIGH (ref 65–99)
Potassium: 4 mmol/L (ref 3.5–5.1)
SODIUM: 140 mmol/L (ref 135–145)

## 2017-03-13 LAB — GLUCOSE, CAPILLARY: GLUCOSE-CAPILLARY: 138 mg/dL — AB (ref 65–99)

## 2017-03-16 ENCOUNTER — Encounter (HOSPITAL_COMMUNITY): Payer: Self-pay

## 2017-03-19 NOTE — Anesthesia Preprocedure Evaluation (Addendum)
Anesthesia Evaluation  Patient identified by MRN, date of birth, ID band Patient awake    Reviewed: Allergy & Precautions, H&P , NPO status , Patient's Chart, lab work & pertinent test results  History of Anesthesia Complications (+) PONV  Airway Mallampati: II  TM Distance: >3 FB Neck ROM: Full    Dental no notable dental hx. (+) Teeth Intact, Dental Advisory Given   Pulmonary neg pulmonary ROS, former smoker,    Pulmonary exam normal breath sounds clear to auscultation       Cardiovascular Exercise Tolerance: Good negative cardio ROS   Rhythm:Regular Rate:Normal     Neuro/Psych negative neurological ROS  negative psych ROS   GI/Hepatic Neg liver ROS, GERD  Medicated and Controlled,  Endo/Other  diabetesHypothyroidism   Renal/GU Renal InsufficiencyRenal disease  negative genitourinary   Musculoskeletal   Abdominal   Peds  Hematology negative hematology ROS (+)   Anesthesia Other Findings   Reproductive/Obstetrics negative OB ROS                            Anesthesia Physical Anesthesia Plan  ASA: II  Anesthesia Plan: General   Post-op Pain Management:    Induction: Intravenous  PONV Risk Score and Plan: 4 or greater and Treatment may vary due to age or medical condition, Ondansetron, Dexamethasone and Diphenhydramine  Airway Management Planned: Oral ETT  Additional Equipment: Arterial line  Intra-op Plan:   Post-operative Plan: Extubation in OR  Informed Consent: I have reviewed the patients History and Physical, chart, labs and discussed the procedure including the risks, benefits and alternatives for the proposed anesthesia with the patient or authorized representative who has indicated his/her understanding and acceptance.   Dental advisory given  Plan Discussed with: CRNA  Anesthesia Plan Comments:         Anesthesia Quick Evaluation

## 2017-03-20 ENCOUNTER — Inpatient Hospital Stay (HOSPITAL_COMMUNITY)
Admission: RE | Admit: 2017-03-20 | Discharge: 2017-03-22 | DRG: 454 | Disposition: A | Discharge status: Home Health | Payer: Medicare Other | Source: Ambulatory Visit | Attending: Neurological Surgery | Admitting: Neurological Surgery

## 2017-03-20 ENCOUNTER — Inpatient Hospital Stay (HOSPITAL_COMMUNITY): Payer: Medicare Other | Admitting: Anesthesiology

## 2017-03-20 ENCOUNTER — Inpatient Hospital Stay (HOSPITAL_COMMUNITY): Payer: Medicare Other

## 2017-03-20 ENCOUNTER — Encounter (HOSPITAL_COMMUNITY): Payer: Self-pay | Admitting: General Practice

## 2017-03-20 ENCOUNTER — Encounter (HOSPITAL_COMMUNITY)
Admission: RE | Disposition: A | Discharge status: Home Health | Payer: Self-pay | Source: Ambulatory Visit | Attending: Neurological Surgery

## 2017-03-20 DIAGNOSIS — Z888 Allergy status to other drugs, medicaments and biological substances status: Secondary | ICD-10-CM

## 2017-03-20 DIAGNOSIS — Z79899 Other long term (current) drug therapy: Secondary | ICD-10-CM | POA: Diagnosis not present

## 2017-03-20 DIAGNOSIS — D62 Acute posthemorrhagic anemia: Secondary | ICD-10-CM | POA: Diagnosis not present

## 2017-03-20 DIAGNOSIS — M4326 Fusion of spine, lumbar region: Secondary | ICD-10-CM | POA: Diagnosis not present

## 2017-03-20 DIAGNOSIS — M48061 Spinal stenosis, lumbar region without neurogenic claudication: Secondary | ICD-10-CM | POA: Diagnosis present

## 2017-03-20 DIAGNOSIS — Z881 Allergy status to other antibiotic agents status: Secondary | ICD-10-CM

## 2017-03-20 DIAGNOSIS — K219 Gastro-esophageal reflux disease without esophagitis: Secondary | ICD-10-CM | POA: Diagnosis present

## 2017-03-20 DIAGNOSIS — Z79890 Hormone replacement therapy (postmenopausal): Secondary | ICD-10-CM

## 2017-03-20 DIAGNOSIS — M4727 Other spondylosis with radiculopathy, lumbosacral region: Secondary | ICD-10-CM | POA: Diagnosis present

## 2017-03-20 DIAGNOSIS — M109 Gout, unspecified: Secondary | ICD-10-CM | POA: Diagnosis present

## 2017-03-20 DIAGNOSIS — Z90710 Acquired absence of both cervix and uterus: Secondary | ICD-10-CM

## 2017-03-20 DIAGNOSIS — Z96653 Presence of artificial knee joint, bilateral: Secondary | ICD-10-CM | POA: Diagnosis present

## 2017-03-20 DIAGNOSIS — Z87891 Personal history of nicotine dependence: Secondary | ICD-10-CM | POA: Diagnosis not present

## 2017-03-20 DIAGNOSIS — E1122 Type 2 diabetes mellitus with diabetic chronic kidney disease: Secondary | ICD-10-CM | POA: Diagnosis present

## 2017-03-20 DIAGNOSIS — Z86718 Personal history of other venous thrombosis and embolism: Secondary | ICD-10-CM | POA: Diagnosis not present

## 2017-03-20 DIAGNOSIS — Z880 Allergy status to penicillin: Secondary | ICD-10-CM

## 2017-03-20 DIAGNOSIS — K589 Irritable bowel syndrome without diarrhea: Secondary | ICD-10-CM | POA: Diagnosis present

## 2017-03-20 DIAGNOSIS — E039 Hypothyroidism, unspecified: Secondary | ICD-10-CM | POA: Diagnosis present

## 2017-03-20 DIAGNOSIS — Z882 Allergy status to sulfonamides status: Secondary | ICD-10-CM

## 2017-03-20 DIAGNOSIS — Z885 Allergy status to narcotic agent status: Secondary | ICD-10-CM

## 2017-03-20 DIAGNOSIS — M4316 Spondylolisthesis, lumbar region: Secondary | ICD-10-CM

## 2017-03-20 DIAGNOSIS — N183 Chronic kidney disease, stage 3 (moderate): Secondary | ICD-10-CM | POA: Diagnosis present

## 2017-03-20 DIAGNOSIS — M4317 Spondylolisthesis, lumbosacral region: Principal | ICD-10-CM | POA: Diagnosis present

## 2017-03-20 DIAGNOSIS — E119 Type 2 diabetes mellitus without complications: Secondary | ICD-10-CM | POA: Diagnosis not present

## 2017-03-20 HISTORY — PX: ANTERIOR LAT LUMBAR FUSION: SHX1168

## 2017-03-20 HISTORY — PX: APPLICATION OF ROBOTIC ASSISTANCE FOR SPINAL PROCEDURE: SHX6753

## 2017-03-20 LAB — GLUCOSE, CAPILLARY
GLUCOSE-CAPILLARY: 180 mg/dL — AB (ref 65–99)
Glucose-Capillary: 120 mg/dL — ABNORMAL HIGH (ref 65–99)
Glucose-Capillary: 203 mg/dL — ABNORMAL HIGH (ref 65–99)

## 2017-03-20 LAB — HEMOGLOBIN AND HEMATOCRIT, BLOOD
HCT: 24.2 % — ABNORMAL LOW (ref 36.0–46.0)
Hemoglobin: 7.9 g/dL — ABNORMAL LOW (ref 12.0–15.0)

## 2017-03-20 LAB — PREPARE RBC (CROSSMATCH)

## 2017-03-20 SURGERY — ANTERIOR LATERAL LUMBAR FUSION 3 LEVELS
Anesthesia: General | Site: Spine Lumbar

## 2017-03-20 MED ORDER — PHENYLEPHRINE HCL 10 MG/ML IJ SOLN
INTRAMUSCULAR | Status: DC | PRN
  Administered 2017-03-20: 20 ug/min via INTRAVENOUS

## 2017-03-20 MED ORDER — LIDOCAINE 2% (20 MG/ML) 5 ML SYRINGE
INTRAMUSCULAR | Status: DC | PRN
  Administered 2017-03-20: 60 mg via INTRAVENOUS

## 2017-03-20 MED ORDER — SUGAMMADEX SODIUM 200 MG/2ML IV SOLN
INTRAVENOUS | Status: DC | PRN
  Administered 2017-03-20 (×2): 100 mg via INTRAVENOUS

## 2017-03-20 MED ORDER — POTASSIUM CHLORIDE ER 10 MEQ PO TBCR
10.0000 meq | EXTENDED_RELEASE_TABLET | ORAL | Status: DC
  Administered 2017-03-20 – 2017-03-22 (×2): 10 meq via ORAL
  Filled 2017-03-20 (×3): qty 1

## 2017-03-20 MED ORDER — DIPHENHYDRAMINE HCL 50 MG/ML IJ SOLN
INTRAMUSCULAR | Status: AC
  Filled 2017-03-20: qty 1

## 2017-03-20 MED ORDER — CHLORHEXIDINE GLUCONATE CLOTH 2 % EX PADS: 6.0000 | MEDICATED_PAD | Freq: Once | CUTANEOUS | Status: DC

## 2017-03-20 MED ORDER — BUPIVACAINE-EPINEPHRINE (PF) 0.5% -1:200000 IJ SOLN
INTRAMUSCULAR | Status: AC
  Filled 2017-03-20: qty 30

## 2017-03-20 MED ORDER — SUGAMMADEX SODIUM 200 MG/2ML IV SOLN
INTRAVENOUS | Status: AC
  Filled 2017-03-20: qty 2

## 2017-03-20 MED ORDER — FEBUXOSTAT 40 MG PO TABS
40.0000 mg | ORAL_TABLET | ORAL | Status: DC
  Filled 2017-03-20: qty 1

## 2017-03-20 MED ORDER — METHOCARBAMOL 750 MG PO TABS
750.0000 mg | ORAL_TABLET | Freq: Four times a day (QID) | ORAL | Status: DC
  Administered 2017-03-20: 750 mg via ORAL

## 2017-03-20 MED ORDER — DIAZEPAM 5 MG PO TABS
5.0000 mg | ORAL_TABLET | Freq: Four times a day (QID) | ORAL | Status: DC | PRN
  Administered 2017-03-20 – 2017-03-21 (×4): 5 mg via ORAL
  Filled 2017-03-20 (×4): qty 1

## 2017-03-20 MED ORDER — DIPHENHYDRAMINE HCL 25 MG PO CAPS
25.0000 mg | ORAL_CAPSULE | Freq: Every day | ORAL | Status: DC
  Administered 2017-03-21: 25 mg via ORAL
  Filled 2017-03-20: qty 1

## 2017-03-20 MED ORDER — LIDOCAINE 2% (20 MG/ML) 5 ML SYRINGE
INTRAMUSCULAR | Status: AC
  Filled 2017-03-20: qty 5

## 2017-03-20 MED ORDER — OXYCODONE HCL ER 20 MG PO T12A
20.0000 mg | EXTENDED_RELEASE_TABLET | Freq: Two times a day (BID) | ORAL | Status: DC
  Administered 2017-03-20: 20 mg via ORAL
  Filled 2017-03-20: qty 1

## 2017-03-20 MED ORDER — VANCOMYCIN HCL IN DEXTROSE 1-5 GM/200ML-% IV SOLN
1000.0000 mg | INTRAVENOUS | Status: DC
  Administered 2017-03-21: 1000 mg via INTRAVENOUS
  Filled 2017-03-20 (×2): qty 200

## 2017-03-20 MED ORDER — ZOLPIDEM TARTRATE 5 MG PO TABS
5.0000 mg | ORAL_TABLET | Freq: Every evening | ORAL | Status: DC | PRN
Start: 2017-03-20 — End: 2017-03-22

## 2017-03-20 MED ORDER — BISACODYL 5 MG PO TBEC: 5.0000 mg | DELAYED_RELEASE_TABLET | Freq: Every day | ORAL | Status: DC | PRN

## 2017-03-20 MED ORDER — SODIUM CHLORIDE 0.9% FLUSH: 3.0000 mL | Freq: Two times a day (BID) | INTRAVENOUS | Status: DC

## 2017-03-20 MED ORDER — THROMBIN (RECOMBINANT) 5000 UNITS EX SOLR
CUTANEOUS | Status: AC
  Filled 2017-03-20: qty 5000

## 2017-03-20 MED ORDER — SODIUM CHLORIDE 0.9 % IJ SOLN
INTRAMUSCULAR | Status: AC
  Filled 2017-03-20: qty 20

## 2017-03-20 MED ORDER — SODIUM CHLORIDE 0.9% FLUSH: 3.0000 mL | INTRAVENOUS | Status: DC | PRN

## 2017-03-20 MED ORDER — ALBUMIN HUMAN 5 % IV SOLN
INTRAVENOUS | Status: AC
  Administered 2017-03-20: 12.5 g via INTRAVENOUS
  Filled 2017-03-20: qty 250

## 2017-03-20 MED ORDER — LIDOCAINE-EPINEPHRINE 2 %-1:100000 IJ SOLN
INTRAMUSCULAR | Status: DC | PRN
  Administered 2017-03-20: 8.5 mL

## 2017-03-20 MED ORDER — ALBUMIN HUMAN 5 % IV SOLN
INTRAVENOUS | Status: DC | PRN
  Administered 2017-03-20 (×2): via INTRAVENOUS

## 2017-03-20 MED ORDER — ALBUMIN HUMAN 5 % IV SOLN
12.5000 g | Freq: Once | INTRAVENOUS | Status: AC
  Administered 2017-03-20: 12.5 g via INTRAVENOUS

## 2017-03-20 MED ORDER — METHOCARBAMOL 500 MG PO TABS
ORAL_TABLET | ORAL | Status: AC
  Administered 2017-03-20: 750 mg via ORAL
  Filled 2017-03-20: qty 1

## 2017-03-20 MED ORDER — ACETAMINOPHEN 500 MG PO TABS
1000.0000 mg | ORAL_TABLET | Freq: Four times a day (QID) | ORAL | Status: DC
  Administered 2017-03-20 – 2017-03-22 (×7): 1000 mg via ORAL
  Filled 2017-03-20 (×7): qty 2

## 2017-03-20 MED ORDER — ARTIFICIAL TEARS OPHTHALMIC OINT
TOPICAL_OINTMENT | OPHTHALMIC | Status: DC | PRN
  Administered 2017-03-20: 1 via OPHTHALMIC

## 2017-03-20 MED ORDER — GABAPENTIN 300 MG PO CAPS
300.0000 mg | ORAL_CAPSULE | Freq: Three times a day (TID) | ORAL | Status: DC
  Administered 2017-03-20 – 2017-03-22 (×6): 300 mg via ORAL
  Filled 2017-03-20 (×6): qty 1

## 2017-03-20 MED ORDER — ROCURONIUM BROMIDE 10 MG/ML (PF) SYRINGE
PREFILLED_SYRINGE | INTRAVENOUS | Status: DC | PRN
  Administered 2017-03-20: 50 mg via INTRAVENOUS
  Administered 2017-03-20: 20 mg via INTRAVENOUS
  Administered 2017-03-20: 30 mg via INTRAVENOUS

## 2017-03-20 MED ORDER — BUPIVACAINE HCL (PF) 0.5 % IJ SOLN
INTRAMUSCULAR | Status: AC
  Filled 2017-03-20: qty 30

## 2017-03-20 MED ORDER — ONDANSETRON HCL 4 MG/2ML IJ SOLN
INTRAMUSCULAR | Status: AC
  Filled 2017-03-20: qty 2

## 2017-03-20 MED ORDER — ONDANSETRON HCL 4 MG/2ML IJ SOLN
INTRAMUSCULAR | Status: DC | PRN
  Administered 2017-03-20: 4 mg via INTRAVENOUS

## 2017-03-20 MED ORDER — FUROSEMIDE 20 MG PO TABS
20.0000 mg | ORAL_TABLET | Freq: Every day | ORAL | Status: DC
  Administered 2017-03-20 – 2017-03-21 (×4): 20 mg via ORAL
  Filled 2017-03-20 (×4): qty 1

## 2017-03-20 MED ORDER — DEXAMETHASONE SODIUM PHOSPHATE 10 MG/ML IJ SOLN
INTRAMUSCULAR | Status: AC
  Filled 2017-03-20: qty 1

## 2017-03-20 MED ORDER — SENNA 8.6 MG PO TABS
1.0000 | ORAL_TABLET | Freq: Two times a day (BID) | ORAL | Status: DC
  Filled 2017-03-20: qty 1

## 2017-03-20 MED ORDER — PANTOPRAZOLE SODIUM 40 MG IV SOLR: 40.0000 mg | Freq: Every day | INTRAVENOUS | Status: DC

## 2017-03-20 MED ORDER — PHENOL 1.4 % MT LIQD: 1.0000 | OROMUCOSAL | Status: DC | PRN

## 2017-03-20 MED ORDER — THROMBIN (RECOMBINANT) 5000 UNITS EX SOLR
CUTANEOUS | Status: DC | PRN
  Administered 2017-03-20 (×2): 5 mL via TOPICAL

## 2017-03-20 MED ORDER — PROPOFOL 500 MG/50ML IV EMUL
INTRAVENOUS | Status: DC | PRN
  Administered 2017-03-20: 30 ug/kg/min via INTRAVENOUS

## 2017-03-20 MED ORDER — BUPIVACAINE LIPOSOME 1.3 % IJ SUSP
INTRAMUSCULAR | Status: DC | PRN
  Administered 2017-03-20: 20 mL

## 2017-03-20 MED ORDER — ROCURONIUM BROMIDE 10 MG/ML (PF) SYRINGE
PREFILLED_SYRINGE | INTRAVENOUS | Status: AC
  Filled 2017-03-20: qty 5

## 2017-03-20 MED ORDER — DIPHENHYDRAMINE HCL 50 MG/ML IJ SOLN
50.0000 mg | Freq: Four times a day (QID) | INTRAMUSCULAR | Status: DC | PRN
  Administered 2017-03-20 – 2017-03-21 (×2): 50 mg via INTRAVENOUS
  Filled 2017-03-20: qty 1

## 2017-03-20 MED ORDER — BACITRACIN 50000 UNITS IM SOLR
INTRAMUSCULAR | Status: DC | PRN
  Administered 2017-03-20: 500 mL

## 2017-03-20 MED ORDER — ACETAMINOPHEN 325 MG PO TABS: 650.0000 mg | ORAL_TABLET | Freq: Four times a day (QID) | ORAL | Status: DC | PRN

## 2017-03-20 MED ORDER — EPHEDRINE 5 MG/ML INJ
INTRAVENOUS | Status: AC
  Filled 2017-03-20: qty 10

## 2017-03-20 MED ORDER — BUPIVACAINE-EPINEPHRINE 0.5% -1:200000 IJ SOLN
INTRAMUSCULAR | Status: DC | PRN
  Administered 2017-03-20: 8.5 mL

## 2017-03-20 MED ORDER — ONDANSETRON HCL 4 MG/2ML IJ SOLN: 4.0000 mg | Freq: Four times a day (QID) | INTRAMUSCULAR | Status: DC | PRN

## 2017-03-20 MED ORDER — SUCCINYLCHOLINE CHLORIDE 20 MG/ML IJ SOLN
INTRAMUSCULAR | Status: DC | PRN
  Administered 2017-03-20: 100 mg via INTRAVENOUS

## 2017-03-20 MED ORDER — FLEET ENEMA 7-19 GM/118ML RE ENEM
1.0000 | ENEMA | Freq: Once | RECTAL | Status: DC | PRN
Start: 2017-03-20 — End: 2017-03-22

## 2017-03-20 MED ORDER — ACETAMINOPHEN 10 MG/ML IV SOLN
INTRAVENOUS | Status: AC
  Filled 2017-03-20: qty 100

## 2017-03-20 MED ORDER — LIDOCAINE-EPINEPHRINE 2 %-1:100000 IJ SOLN
INTRAMUSCULAR | Status: AC
  Filled 2017-03-20: qty 1

## 2017-03-20 MED ORDER — ACETAMINOPHEN 10 MG/ML IV SOLN
INTRAVENOUS | Status: DC | PRN
  Administered 2017-03-20: 1000 mg via INTRAVENOUS

## 2017-03-20 MED ORDER — CELECOXIB 200 MG PO CAPS
ORAL_CAPSULE | ORAL | Status: AC
  Filled 2017-03-20: qty 1

## 2017-03-20 MED ORDER — FENTANYL CITRATE (PF) 250 MCG/5ML IJ SOLN
INTRAMUSCULAR | Status: DC | PRN
  Administered 2017-03-20 (×4): 50 ug via INTRAVENOUS
  Administered 2017-03-20 (×2): 100 ug via INTRAVENOUS

## 2017-03-20 MED ORDER — EPHEDRINE SULFATE-NACL 50-0.9 MG/10ML-% IV SOSY
PREFILLED_SYRINGE | INTRAVENOUS | Status: DC | PRN
  Administered 2017-03-20: 10 mg via INTRAVENOUS

## 2017-03-20 MED ORDER — CELECOXIB 200 MG PO CAPS
200.0000 mg | ORAL_CAPSULE | Freq: Two times a day (BID) | ORAL | Status: DC
  Administered 2017-03-20: 200 mg via ORAL

## 2017-03-20 MED ORDER — 0.9 % SODIUM CHLORIDE (POUR BTL) OPTIME
TOPICAL | Status: DC | PRN
  Administered 2017-03-20: 1000 mL

## 2017-03-20 MED ORDER — POLYETHYLENE GLYCOL 3350 17 G PO PACK
17.0000 g | PACK | Freq: Two times a day (BID) | ORAL | Status: DC
  Administered 2017-03-20 – 2017-03-22 (×4): 17 g via ORAL
  Filled 2017-03-20 (×4): qty 1

## 2017-03-20 MED ORDER — LEVOTHYROXINE SODIUM 100 MCG PO TABS
50.0000 ug | ORAL_TABLET | Freq: Every day | ORAL | Status: DC
  Administered 2017-03-20 – 2017-03-21 (×2): 50 ug via ORAL
  Filled 2017-03-20 (×2): qty 1

## 2017-03-20 MED ORDER — OXYCODONE HCL 5 MG PO TABS: 10.0000 mg | ORAL_TABLET | ORAL | Status: DC | PRN

## 2017-03-20 MED ORDER — PROPOFOL 10 MG/ML IV BOLUS
INTRAVENOUS | Status: DC | PRN
  Administered 2017-03-20: 30 mg via INTRAVENOUS
  Administered 2017-03-20: 50 mg via INTRAVENOUS

## 2017-03-20 MED ORDER — VANCOMYCIN HCL IN DEXTROSE 1-5 GM/200ML-% IV SOLN
1000.0000 mg | INTRAVENOUS | Status: AC
  Administered 2017-03-20: 1000 mg via INTRAVENOUS
  Filled 2017-03-20: qty 200

## 2017-03-20 MED ORDER — DOCUSATE SODIUM 100 MG PO CAPS
100.0000 mg | ORAL_CAPSULE | Freq: Two times a day (BID) | ORAL | Status: DC
  Administered 2017-03-21 (×2): 100 mg via ORAL
  Filled 2017-03-20 (×2): qty 1

## 2017-03-20 MED ORDER — BUPIVACAINE LIPOSOME 1.3 % IJ SUSP
20.0000 mL | Freq: Once | INTRAMUSCULAR | Status: DC
  Filled 2017-03-20: qty 20

## 2017-03-20 MED ORDER — LACTATED RINGERS IV SOLN
INTRAVENOUS | Status: DC | PRN
  Administered 2017-03-20: 07:00:00 via INTRAVENOUS

## 2017-03-20 MED ORDER — VANCOMYCIN HCL 1000 MG IV SOLR
INTRAVENOUS | Status: DC | PRN
  Administered 2017-03-20: 1000 mg via TOPICAL

## 2017-03-20 MED ORDER — HYDROMORPHONE HCL 1 MG/ML IJ SOLN
0.2500 mg | INTRAMUSCULAR | Status: DC | PRN
  Administered 2017-03-20 (×3): 0.5 mg via INTRAVENOUS

## 2017-03-20 MED ORDER — METHOCARBAMOL 500 MG PO TABS
ORAL_TABLET | ORAL | Status: AC
  Filled 2017-03-20: qty 1

## 2017-03-20 MED ORDER — PHENYLEPHRINE 40 MCG/ML (10ML) SYRINGE FOR IV PUSH (FOR BLOOD PRESSURE SUPPORT)
PREFILLED_SYRINGE | INTRAVENOUS | Status: AC
  Filled 2017-03-20: qty 10

## 2017-03-20 MED ORDER — SODIUM CHLORIDE 0.9 % IV SOLN: 250.0000 mL | INTRAVENOUS | Status: DC

## 2017-03-20 MED ORDER — MENTHOL 3 MG MT LOZG: 1.0000 | LOZENGE | OROMUCOSAL | Status: DC | PRN

## 2017-03-20 MED ORDER — ONDANSETRON HCL 4 MG PO TABS: 4.0000 mg | ORAL_TABLET | Freq: Four times a day (QID) | ORAL | Status: DC | PRN

## 2017-03-20 MED ORDER — VITAMIN D (ERGOCALCIFEROL) 1.25 MG (50000 UNIT) PO CAPS: 50000.0000 [IU] | ORAL_CAPSULE | ORAL | Status: DC

## 2017-03-20 MED ORDER — FENTANYL CITRATE (PF) 250 MCG/5ML IJ SOLN
INTRAMUSCULAR | Status: AC
  Filled 2017-03-20: qty 5

## 2017-03-20 MED ORDER — OXYCODONE HCL 5 MG PO TABS: 5.0000 mg | ORAL_TABLET | ORAL | Status: DC | PRN

## 2017-03-20 MED ORDER — PROPOFOL 10 MG/ML IV BOLUS
INTRAVENOUS | Status: AC
  Filled 2017-03-20: qty 20

## 2017-03-20 MED ORDER — GLYCOPYRROLATE 0.2 MG/ML IJ SOLN
INTRAMUSCULAR | Status: DC | PRN
  Administered 2017-03-20 (×3): 0.1 mg via INTRAVENOUS

## 2017-03-20 MED ORDER — HYDROMORPHONE HCL 1 MG/ML IJ SOLN
INTRAMUSCULAR | Status: AC
  Administered 2017-03-20: 0.5 mg via INTRAVENOUS
  Filled 2017-03-20: qty 1

## 2017-03-20 MED ORDER — SODIUM CHLORIDE 0.9 % IV SOLN
INTRAVENOUS | Status: DC
  Administered 2017-03-21: 12:00:00 via INTRAVENOUS

## 2017-03-20 MED ORDER — SODIUM CHLORIDE 0.9 % IJ SOLN
INTRAMUSCULAR | Status: DC | PRN
  Administered 2017-03-20: 10 mL

## 2017-03-20 MED ORDER — BUPIVACAINE HCL (PF) 0.5 % IJ SOLN
INTRAMUSCULAR | Status: DC | PRN
  Administered 2017-03-20: 5 mL

## 2017-03-20 MED ORDER — SUCCINYLCHOLINE CHLORIDE 200 MG/10ML IV SOSY
PREFILLED_SYRINGE | INTRAVENOUS | Status: AC
  Filled 2017-03-20: qty 10

## 2017-03-20 MED ORDER — VANCOMYCIN HCL 1000 MG IV SOLR
INTRAVENOUS | Status: AC
  Filled 2017-03-20: qty 1000

## 2017-03-20 MED ORDER — DEXAMETHASONE SODIUM PHOSPHATE 10 MG/ML IJ SOLN
INTRAMUSCULAR | Status: DC | PRN
  Administered 2017-03-20: 5 mg via INTRAVENOUS

## 2017-03-20 SURGICAL SUPPLY — 109 items
BATTALION LLIF ITRADISCAL SHIM (MISCELLANEOUS) ×3
BATTALION SPACER 22X8X50 0D (Spacer) ×2 IMPLANT
BATTALION SPACER 22X8X50MM 0D (Spacer) ×1 IMPLANT
BIT DRILL LONG 3.0X30 (BIT) IMPLANT
BIT DRILL LONG 3.0X30MM (BIT)
BIT DRILL LONG 3X80 (BIT) IMPLANT
BIT DRILL LONG 3X80MM (BIT)
BIT DRILL LONG 4X80 (BIT) IMPLANT
BIT DRILL LONG 4X80MM (BIT)
BIT DRILL SHORT 3.0X30 (BIT) ×2 IMPLANT
BIT DRILL SHORT 3.0X30MM (BIT) ×1
BIT DRILL SHORT 3X80 (BIT) IMPLANT
BIT DRILL SHORT 3X80MM (BIT)
BLADE CLIPPER SURG (BLADE) IMPLANT
BLADE SURG 11 STRL SS (BLADE) ×3 IMPLANT
BUR MATCHSTICK NEURO 3.0 LAGG (BURR) ×3 IMPLANT
CABLE BATTALION LLIF LIGHT (MISCELLANEOUS) ×3 IMPLANT
CHLORAPREP W/TINT 26ML (MISCELLANEOUS) ×6 IMPLANT
CONT SPEC 4OZ CLIKSEAL STRL BL (MISCELLANEOUS) ×3 IMPLANT
COUNTER NEEDLE 20 DBL MAG RED (NEEDLE) ×3 IMPLANT
DECANTER SPIKE VIAL GLASS SM (MISCELLANEOUS) ×6 IMPLANT
DERMABOND ADVANCED (GAUZE/BANDAGES/DRESSINGS) ×8
DERMABOND ADVANCED .7 DNX12 (GAUZE/BANDAGES/DRESSINGS) ×4 IMPLANT
DILATOR INSULATED XL 8X13 (MISCELLANEOUS) ×3 IMPLANT
DISSECTOR BLUNT TIP ENDO 5MM (MISCELLANEOUS) IMPLANT
DRAPE C-ARM 42X72 X-RAY (DRAPES) ×9 IMPLANT
DRAPE C-ARMOR (DRAPES) ×6 IMPLANT
DRAPE LAPAROTOMY 100X72X124 (DRAPES) ×6 IMPLANT
DRAPE POUCH INSTRU U-SHP 10X18 (DRAPES) ×6 IMPLANT
DRAPE SHEET LG 3/4 BI-LAMINATE (DRAPES) ×3 IMPLANT
DRSG OPSITE POSTOP 3X4 (GAUZE/BANDAGES/DRESSINGS) ×6 IMPLANT
DRSG OPSITE POSTOP 4X8 (GAUZE/BANDAGES/DRESSINGS) ×3 IMPLANT
ELECT BLADE 4.0 EZ CLEAN MEGAD (MISCELLANEOUS)
ELECT COATED BLADE 2.86 ST (ELECTRODE) ×9 IMPLANT
ELECT REM PT RETURN 9FT ADLT (ELECTROSURGICAL) ×6
ELECTRODE BLDE 4.0 EZ CLN MEGD (MISCELLANEOUS) IMPLANT
ELECTRODE REM PT RTRN 9FT ADLT (ELECTROSURGICAL) ×2 IMPLANT
EVACUATOR 1/8 PVC DRAIN (DRAIN) ×3 IMPLANT
FEE INTRAOP MONITOR IMPULS NCS (MISCELLANEOUS) ×1 IMPLANT
GAUZE SPONGE 4X4 16PLY XRAY LF (GAUZE/BANDAGES/DRESSINGS) IMPLANT
GLOVE BIO SURGEON STRL SZ7 (GLOVE) IMPLANT
GLOVE BIOGEL PI IND STRL 6.5 (GLOVE) ×2 IMPLANT
GLOVE BIOGEL PI IND STRL 7.0 (GLOVE) ×2 IMPLANT
GLOVE BIOGEL PI IND STRL 7.5 (GLOVE) ×2 IMPLANT
GLOVE BIOGEL PI IND STRL 8 (GLOVE) ×2 IMPLANT
GLOVE BIOGEL PI INDICATOR 6.5 (GLOVE) ×4
GLOVE BIOGEL PI INDICATOR 7.0 (GLOVE) ×4
GLOVE BIOGEL PI INDICATOR 7.5 (GLOVE) ×4
GLOVE BIOGEL PI INDICATOR 8 (GLOVE) ×4
GLOVE ECLIPSE 8.0 STRL XLNG CF (GLOVE) ×6 IMPLANT
GLOVE SS BIOGEL STRL SZ 7.5 (GLOVE) ×5 IMPLANT
GLOVE SUPERSENSE BIOGEL SZ 7.5 (GLOVE) ×10
GLOVE SURG SS PI 6.5 STRL IVOR (GLOVE) ×9 IMPLANT
GOWN STRL REUS W/ TWL LRG LVL3 (GOWN DISPOSABLE) ×4 IMPLANT
GOWN STRL REUS W/ TWL XL LVL3 (GOWN DISPOSABLE) ×1 IMPLANT
GOWN STRL REUS W/TWL 2XL LVL3 (GOWN DISPOSABLE) ×3 IMPLANT
GOWN STRL REUS W/TWL LRG LVL3 (GOWN DISPOSABLE) ×8
GOWN STRL REUS W/TWL XL LVL3 (GOWN DISPOSABLE) ×2
GRAFT BN 10X1XDBM MAGNIFUSE (Bone Implant) ×1 IMPLANT
GRAFT BONE MAGNIFUSE 1X10CM (Bone Implant) ×2 IMPLANT
GUIDEWIRE 320MM BLUNT TIP (WIRE) ×9 IMPLANT
HEMOSTAT POWDER KIT SURGIFOAM (HEMOSTASIS) ×3 IMPLANT
INTRAOP MONITOR FEE IMPULS NCS (MISCELLANEOUS) ×1
INTRAOP MONITOR FEE IMPULSE (MISCELLANEOUS) ×2
KIT BASIN OR (CUSTOM PROCEDURE TRAY) ×3 IMPLANT
KIT INFUSE SMALL (Orthopedic Implant) ×3 IMPLANT
KIT ROOM TURNOVER OR (KITS) ×3 IMPLANT
KIT SPINE MAZOR X ROBO DISP (MISCELLANEOUS) ×3 IMPLANT
KNIFE ANNULOTOMY (BLADE) ×3 IMPLANT
LEAD WIRE MULTI STAGE DISP (NEUROSURGERY SUPPLIES) ×3 IMPLANT
MILL MEDIUM DISP (BLADE) ×3 IMPLANT
NEEDLE HYPO 21X1.5 SAFETY (NEEDLE) ×6 IMPLANT
NEEDLE HYPO 25X1 1.5 SAFETY (NEEDLE) ×3 IMPLANT
NS IRRIG 1000ML POUR BTL (IV SOLUTION) ×3 IMPLANT
PACK LAMINECTOMY NEURO (CUSTOM PROCEDURE TRAY) ×6 IMPLANT
PATTIES SURGICAL 1X1 (DISPOSABLE) ×3 IMPLANT
PIN HEAD 2.5X60MM (PIN) IMPLANT
PROBE BALL TIP STIM 200MM 2.3M (NEUROSURGERY SUPPLIES) ×3 IMPLANT
PUTTY DBM GRAFTON 5CC (Putty) ×6 IMPLANT
ROD PC 5.5X90 TI ARSENAL (Rod) ×6 IMPLANT
SCREW PA 7.5X45 ARSENAL (Screw) ×24 IMPLANT
SCREW SCHANZ SA 4.0MM (MISCELLANEOUS) IMPLANT
SCREW SET SPINAL ARSENAL 47127 (Screw) ×24 IMPLANT
SEALER BIPOLAR AQUA 2.3 (INSTRUMENTS) ×3 IMPLANT
SHIM ITRADISCAL BATTALION LLIF (MISCELLANEOUS) ×1 IMPLANT
SPACER BATTALION 22X10X55M 10 (Spacer) ×3 IMPLANT
SPACER BATTALION 22X8X50 0D (Spacer) ×1 IMPLANT
SPACER LORD BATT 10D 22X10X50 (Spacer) ×3 IMPLANT
SPONGE LAP 4X18 X RAY DECT (DISPOSABLE) IMPLANT
STAPLER VISISTAT 35W (STAPLE) ×3 IMPLANT
SUT STRATAFIX 1PDS 45CM VIOLET (SUTURE) ×3 IMPLANT
SUT STRATAFIX MNCRL+ 3-0 PS-2 (SUTURE) ×1
SUT STRATAFIX MONOCRYL 3-0 (SUTURE) ×2
SUT STRATAFIX SPIRAL + 2-0 (SUTURE) ×3 IMPLANT
SUT VIC AB 0 CT1 18XCR BRD8 (SUTURE) ×2 IMPLANT
SUT VIC AB 0 CT1 8-18 (SUTURE) ×4
SUT VIC AB 1 CT1 18XBRD ANBCTR (SUTURE) IMPLANT
SUT VIC AB 1 CT1 8-18 (SUTURE)
SUT VIC AB 2-0 CT1 18 (SUTURE) ×3 IMPLANT
SUT VIC AB 3-0 SH 8-18 (SUTURE) IMPLANT
SUTURE STRATFX MNCRL+ 3-0 PS-2 (SUTURE) ×1 IMPLANT
SYR 30ML LL (SYRINGE) ×3 IMPLANT
SYRINGE 20CC LL (MISCELLANEOUS) ×3 IMPLANT
TIP TROCAR NITINOL ILLICO 20 (INSTRUMENTS) ×24 IMPLANT
TOWEL GREEN STERILE (TOWEL DISPOSABLE) ×3 IMPLANT
TOWEL GREEN STERILE FF (TOWEL DISPOSABLE) ×3 IMPLANT
TRAY FOLEY W/METER SILVER 16FR (SET/KITS/TRAYS/PACK) ×3 IMPLANT
TUBE MAZOR SA REDUCTION (TUBING) ×3 IMPLANT
WATER STERILE IRR 1000ML POUR (IV SOLUTION) ×3 IMPLANT

## 2017-03-20 NOTE — Anesthesia Postprocedure Evaluation (Signed)
Anesthesia Post Note  Patient: Amanda Warner  Procedure(s) Performed: Lumbar two-five Transpsoas lumbar interbody fusion, Lumbar two-five laminectomy for decompression with posterior segmental instrumented fusion/Mazor (N/A Spine Lumbar) APPLICATION OF ROBOTIC ASSISTANCE FOR SPINAL PROCEDURE (N/A Spine Lumbar)     Patient location during evaluation: PACU Anesthesia Type: General Level of consciousness: awake and alert Pain management: pain level controlled Vital Signs Assessment: post-procedure vital signs reviewed and stable Respiratory status: spontaneous breathing, nonlabored ventilation, respiratory function stable and patient connected to nasal cannula oxygen Cardiovascular status: blood pressure returned to baseline and stable Postop Assessment: no apparent nausea or vomiting Anesthetic complications: no    Last Vitals:  Vitals:   03/20/17 1743 03/20/17 1749  BP:  102/63  Pulse: (!) 48 (!) 54  Resp: 10 12  Temp:    SpO2: 100% 100%    Last Pain:  Vitals:   03/20/17 1645  TempSrc:   PainSc: Asleep                 Edi Gorniak,W. EDMOND

## 2017-03-20 NOTE — Progress Notes (Signed)
Pharmacy Antibiotic Note  Amanda Warner is a 82 y.o. female admitted on 03/20/2017 for planned spinal surgery.  Pharmacy has been consulted for Vancomycin dosing post-op for surgical prophylaxis.   The patient received Vancomycin pre-op at 0800. SCr 1.16, CrCl~40. Per discussion with RN - the patient DOES have a drain in place.   Plan: 1. Start Vancomycin 1g IV every 24 hours - next dose on 11/6 AM 2. Will continue to follow renal function and drain duration  Height: 5\' 5"  (165.1 cm) Weight: 166 lb 6.4 oz (75.5 kg) IBW/kg (Calculated) : 57  Temp (24hrs), Avg:97.3 F (36.3 C), Min:97.2 F (36.2 C), Max:97.8 F (36.6 C)  No results for input(s): WBC, CREATININE, LATICACIDVEN, VANCOTROUGH, VANCOPEAK, VANCORANDOM, GENTTROUGH, GENTPEAK, GENTRANDOM, TOBRATROUGH, TOBRAPEAK, TOBRARND, AMIKACINPEAK, AMIKACINTROU, AMIKACIN in the last 168 hours.  Estimated Creatinine Clearance: 40 mL/min (A) (by C-G formula based on SCr of 1.16 mg/dL (H)).    Allergies  Allergen Reactions  . Penicillins Anaphylaxis and Swelling  . Allopurinol Other (See Comments)    Liver damage  . Codeine Hives and Other (See Comments)    Sedation   . Doxycycline Hives  . Sudafed [Pseudoephedrine Hcl] Hives  . Sulfa Antibiotics Hives  . Tramadol Hives    Thank you for allowing pharmacy to be a part of this patient's care.  Chauna, Osoria 03/20/2017 8:19 PM

## 2017-03-20 NOTE — Transfer of Care (Signed)
Immediate Anesthesia Transfer of Care Note  Patient: Amanda Warner  Procedure(s) Performed: Lumbar two-five Transpsoas lumbar interbody fusion, Lumbar two-five laminectomy for decompression with posterior segmental instrumented fusion/Mazor (N/A Spine Lumbar) APPLICATION OF ROBOTIC ASSISTANCE FOR SPINAL PROCEDURE (N/A Spine Lumbar)  Patient Location: PACU  Anesthesia Type:General  Level of Consciousness: drowsy and patient cooperative  Airway & Oxygen Therapy: Patient Spontanous Breathing and Patient connected to nasal cannula oxygen  Post-op Assessment: Report given to RN and Post -op Vital signs reviewed and stable  Post vital signs: Reviewed and stable  Last Vitals:  Vitals:   03/20/17 1349 03/20/17 1350  BP: (!) 95/58   Pulse: 65 65  Resp:  15  Temp:    SpO2: 100% 100%    Last Pain:  Vitals:   03/20/17 0611  TempSrc: Oral         Complications: No apparent anesthesia complications

## 2017-03-20 NOTE — Plan of Care (Signed)
  Progressing Safety: Ability to remain free from injury will improve 03/20/2017 2222 - Progressing by Jonnie Finner, RN Activity: Ability to avoid complications of mobility impairment will improve 03/20/2017 2222 - Progressing by Jonnie Finner, RN Ability to tolerate increased activity will improve 03/20/2017 2222 - Progressing by Jonnie Finner, RN Will remain free from falls 03/20/2017 2222 - Progressing by Jonnie Finner, RN Bowel/Gastric: Gastrointestinal status for postoperative course will improve 03/20/2017 2222 - Progressing by Jonnie Finner, RN Education: Ability to verbalize activity precautions or restrictions will improve 03/20/2017 2222 - Progressing by Jonnie Finner, RN Knowledge of the prescribed therapeutic regimen will improve 03/20/2017 2222 - Progressing by Jonnie Finner, RN Understanding of discharge needs will improve 03/20/2017 2222 - Progressing by Jonnie Finner, RN Physical Regulation: Ability to maintain clinical measurements within normal limits will improve 03/20/2017 2222 - Progressing by Jonnie Finner, RN Postoperative complications will be avoided or minimized 03/20/2017 2222 - Progressing by Jonnie Finner, RN Diagnostic test results will improve 03/20/2017 2222 - Progressing by Jonnie Finner, RN Pain Management: Pain level will decrease 03/20/2017 2222 - Progressing by Jonnie Finner, RN Skin Integrity: Signs of wound healing will improve 03/20/2017 2222 - Progressing by Jonnie Finner, RN Health Behavior/Discharge Planning: Identification of resources available to assist in meeting health care needs will improve 03/20/2017 2222 - Progressing by Jonnie Finner, RN Bladder/Genitourinary: Urinary functional status for postoperative course will improve 03/20/2017 2222 - Progressing by  Jonnie Finner, RN

## 2017-03-20 NOTE — H&P (Signed)
CC:  No chief complaint on file. Back and leg pain  HPI: Amanda Warner is a 82 y.o. female with back and right leg pain.  She has a spondylolisthesis at L4-5 and lumbosacral spondylosis with radiculopathy.  She presents for elective fusion and decompression L2-5.  She denies changes since I saw her last in clinic.  PMH: Past Medical History:  Diagnosis Date  . Arthritis   . Chronic kidney disease    3 th stage kidney disease  . Complication of anesthesia   . Diabetes mellitus without complication (HCC)    no meds  . Dyspnea   . Edema   . GERD (gastroesophageal reflux disease)   . Gout   . Hx of blood clots    lt leg  . Hypothyroidism   . IBS (irritable bowel syndrome)   . Pneumonia   . PONV (postoperative nausea and vomiting)     PSH: Past Surgical History:  Procedure Laterality Date  . ABDOMINAL HYSTERECTOMY    . DIAGNOSTIC LAPAROSCOPY    . HERNIA REPAIR    . JOINT REPLACEMENT     bil knees  . VEIN LIGATION AND STRIPPING      SH: Social History   Tobacco Use  . Smoking status: Former Research scientist (life sciences)  . Smokeless tobacco: Never Used  Substance Use Topics  . Alcohol use: Yes    Comment: occ  . Drug use: No    MEDS: Prior to Admission medications   Medication Sig Start Date End Date Taking? Authorizing Provider  acetaminophen (TYLENOL) 500 MG tablet Take 500 mg by mouth every 6 (six) hours as needed for moderate pain or headache.    Yes [provider]  diphenhydrAMINE (BENADRYL) 25 mg capsule Take 25 mg by mouth at bedtime.   Yes [provider]  febuxostat (ULORIC) 40 MG tablet Take 40 mg by mouth every other day.    Yes [provider]  furosemide (LASIX) 20 MG tablet Take 20 mg by mouth daily.   Yes [provider]  gabapentin (NEURONTIN) 300 MG capsule Take 300 mg by mouth at bedtime.    Yes [provider]  levothyroxine (SYNTHROID, LEVOTHROID) 50 MCG tablet Take 50 mcg by mouth daily before breakfast.   Yes [provider]  polyethylene glycol (MIRALAX / GLYCOLAX) packet Take 17 g by mouth 2 (two) times daily.   Yes [provider]  potassium chloride (K-DUR) 10 MEQ tablet Take 10 mEq by mouth every other day.    Yes [provider]  Vitamin D, Ergocalciferol, (DRISDOL) 50000 UNITS CAPS capsule Take 50,000 Units by mouth every 14 (fourteen) days.    Yes [provider]  enoxaparin (LOVENOX) 40 MG/0.4ML injection Inject 0.4 mLs (40 mg total) into the skin daily. Patient not taking: Reported on 03/06/2017 05/21/14   Carlynn Spry, PA-C  HYDROmorphone (DILAUDID) 4 MG tablet Take 1 tablet (4 mg total) by mouth every 3 (three) hours as needed for severe pain. Patient not taking: Reported on 03/06/2017 05/21/14   Carlynn Spry, PA-C  methocarbamol (ROBAXIN) 500 MG tablet Take 1-2 tablets (500-1,000 mg total) by mouth every 6 (six) hours as needed for muscle spasms. Patient not taking: Reported on 03/06/2017 05/21/14   Carlynn Spry, PA-C  metoCLOPramide (REGLAN) 5 MG tablet Take 1 tablet (5 mg total) by mouth every 6 (six) hours as needed for nausea (if ondansetron (ZOFRAN) ineffective.). Patient not taking: Reported on 03/06/2017 05/21/14   Carlynn Spry, PA-C    ALLERGY: Allergies  Allergen Reactions  . Penicillins Anaphylaxis and Swelling  . Allopurinol Other (See Comments)    Liver damage  . Codeine Hives and Other (See Comments)    Sedation   . Doxycycline Hives  . Sudafed [Pseudoephedrine Hcl] Hives  . Sulfa Antibiotics Hives  . Tramadol Hives    ROS: ROS  NEUROLOGIC EXAM: Awake, alert, oriented Memory and concentration grossly intact Speech fluent, appropriate CN grossly intact Motor exam: Upper Extremities Deltoid Bicep Tricep Grip  Right 5/5 5/5 5/5 5/5  Left 5/5 5/5 5/5 5/5   Lower Extremity IP Quad PF DF EHL  Right 5/5 5/5 5/5 5/5 5/5  Left 5/5 5/5 5/5 5/5 5/5   Sensation grossly intact to LT  IMAGING: No new imaging  IMPRESSION: - 82 y.o.  female with spondylolisthesis and lumbosacral spondylosis with radiculopathy.  Neuro intact.  PLAN: - L2-5 anterior interbody fusion via transpsoas approach; L2-5 laminectomy for decompression with L4-5 Gill procedure, L2-5 posterior segmental instrumented fusion - We have reviewed the risks, benefits, and alternatives to surgery and she wishes to proceed.

## 2017-03-20 NOTE — Anesthesia Procedure Notes (Signed)
Arterial Line Insertion Start/End11/09/2016 7:34 AM Performed by: Julieta Bellini, CRNA, CRNA  Patient location: Pre-op. Preanesthetic checklist: patient identified, IV checked, site marked, risks and benefits discussed, surgical consent, monitors and equipment checked, pre-op evaluation, timeout performed and anesthesia consent Lidocaine 1% used for infiltration Left, radial was placed Catheter size: 20 G Hand hygiene performed  and maximum sterile barriers used   Attempts: 1 Procedure performed without using ultrasound guided technique. Following insertion, Biopatch. Post procedure assessment: normal  Patient tolerated the procedure well with no immediate complications.

## 2017-03-20 NOTE — Brief Op Note (Signed)
03/20/2017  1:56 PM  PATIENT:  Amanda Warner  82 y.o. female  PRE-OPERATIVE DIAGNOSIS:  Spondylolisthesis, Lumbosacral region  POST-OPERATIVE DIAGNOSIS:  Spondylolisthesis, Lumbosacral region  PROCEDURE:  Procedure(s): Lumbar two-five Transpsoas lumbar interbody fusion, Lumbar two-five laminectomy for decompression with posterior segmental instrumented fusion/Mazor (N/A) APPLICATION OF ROBOTIC ASSISTANCE FOR SPINAL PROCEDURE (N/A)  SURGEON:  Surgeon(s) and Role:    * Ditty, Kevan Ny, MD - Primary    * Newman Pies, MD - Assisting  PHYSICIAN ASSISTANT:   ASSISTANTS: Newman Pies, MD   ANESTHESIA:   general  EBL:  1000 mL   BLOOD ADMINISTERED:none  DRAINS: Medium hemovac  LOCAL MEDICATIONS USED:  MARCAINE    and LIDOCAINE   SPECIMEN:  No Specimen  DISPOSITION OF SPECIMEN:  N/A  COUNTS:  YES  TOURNIQUET:  * No tourniquets in log *  DICTATION: .Dragon Dictation  PLAN OF CARE: Admit to inpatient   PATIENT DISPOSITION:  PACU - hemodynamically stable.   Delay start of Pharmacological VTE agent (>24hrs) due to surgical blood loss or risk of bleeding: yes

## 2017-03-20 NOTE — Anesthesia Procedure Notes (Signed)
Procedure Name: Intubation Date/Time: 03/20/2017 7:55 AM Performed by: Julieta Bellini, CRNA Pre-anesthesia Checklist: Patient identified, Emergency Drugs available, Suction available and Patient being monitored Patient Re-evaluated:Patient Re-evaluated prior to induction Oxygen Delivery Method: Circle system utilized Preoxygenation: Pre-oxygenation with 100% oxygen Induction Type: IV induction Ventilation: Mask ventilation without difficulty Laryngoscope Size: Mac and 3 Grade View: Grade I Tube type: Oral Tube size: 7.0 mm Number of attempts: 1 Airway Equipment and Method: Stylet Placement Confirmation: ETT inserted through vocal cords under direct vision,  positive ETCO2 and breath sounds checked- equal and bilateral Secured at: 21 cm Tube secured with: Tape Dental Injury: Teeth and Oropharynx as per pre-operative assessment

## 2017-03-21 LAB — BASIC METABOLIC PANEL
ANION GAP: 7 (ref 5–15)
BUN: 26 mg/dL — ABNORMAL HIGH (ref 6–20)
CALCIUM: 8.4 mg/dL — AB (ref 8.9–10.3)
CO2: 21 mmol/L — ABNORMAL LOW (ref 22–32)
Chloride: 110 mmol/L (ref 101–111)
Creatinine, Ser: 1.08 mg/dL — ABNORMAL HIGH (ref 0.44–1.00)
GFR, EST AFRICAN AMERICAN: 55 mL/min — AB (ref >60)
GFR, EST NON AFRICAN AMERICAN: 48 mL/min — AB (ref >60)
Glucose, Bld: 134 mg/dL — ABNORMAL HIGH (ref 65–99)
POTASSIUM: 4.4 mmol/L (ref 3.5–5.1)
Sodium: 138 mmol/L (ref 135–145)

## 2017-03-21 LAB — CBC
HCT: 29.8 % — ABNORMAL LOW (ref 36.0–46.0)
Hemoglobin: 9.7 g/dL — ABNORMAL LOW (ref 12.0–15.0)
MCH: 27.9 pg (ref 26.0–34.0)
MCHC: 32.6 g/dL (ref 30.0–36.0)
MCV: 85.6 fL (ref 78.0–100.0)
PLATELETS: 84 10*3/uL — AB (ref 150–400)
RBC: 3.48 MIL/uL — ABNORMAL LOW (ref 3.87–5.11)
RDW: 14.9 % (ref 11.5–15.5)
WBC: 8 10*3/uL (ref 4.0–10.5)

## 2017-03-21 LAB — POCT I-STAT 4, (NA,K, GLUC, HGB,HCT)
GLUCOSE: 171 mg/dL — AB (ref 65–99)
HCT: 30 % — ABNORMAL LOW (ref 36.0–46.0)
Hemoglobin: 10.2 g/dL — ABNORMAL LOW (ref 12.0–15.0)
POTASSIUM: 3.8 mmol/L (ref 3.5–5.1)
SODIUM: 141 mmol/L (ref 135–145)

## 2017-03-21 LAB — BPAM RBC
BLOOD PRODUCT EXPIRATION DATE: 201811222359
BLOOD PRODUCT EXPIRATION DATE: 201811222359
ISSUE DATE / TIME: 201811051526
ISSUE DATE / TIME: 201811051526
UNIT TYPE AND RH: 6200
Unit Type and Rh: 6200

## 2017-03-21 LAB — TYPE AND SCREEN
ABO/RH(D): A POS
ANTIBODY SCREEN: NEGATIVE
UNIT DIVISION: 0
Unit division: 0

## 2017-03-21 LAB — GLUCOSE, CAPILLARY
GLUCOSE-CAPILLARY: 143 mg/dL — AB (ref 65–99)
Glucose-Capillary: 109 mg/dL — ABNORMAL HIGH (ref 65–99)
Glucose-Capillary: 150 mg/dL — ABNORMAL HIGH (ref 65–99)

## 2017-03-21 MED ORDER — DIPHENHYDRAMINE HCL 25 MG PO CAPS: 25.0000 mg | ORAL_CAPSULE | Freq: Four times a day (QID) | ORAL | Status: DC | PRN

## 2017-03-21 MED ORDER — PANTOPRAZOLE SODIUM 40 MG PO TBEC
40.0000 mg | DELAYED_RELEASE_TABLET | Freq: Every day | ORAL | Status: DC
  Administered 2017-03-21: 40 mg via ORAL
  Filled 2017-03-21: qty 1

## 2017-03-21 MED FILL — Thrombin (Recombinant) For Soln 5000 Unit: CUTANEOUS | Qty: 5000 | Status: AC

## 2017-03-21 NOTE — Evaluation (Signed)
Physical Therapy Evaluation Patient Details Name: Amanda Warner MRN: [id number] DOB: 07-07-1934 Today's Date: 03/21/2017   History of Present Illness  Patient is a 82 yo female s/p L2-5 anterior interbody fusion via transpsoas approach; L2-5 laminectomy for decompression with L4-5 Gill procedure, L2-5 posterior segmental instrumented fusion  Clinical Impression  Orders received for PT evaluation. Patient demonstrates deficits in functional mobility as indicated below. Will benefit from continued skilled PT to address deficits and maximize function. Will see as indicated and progress as tolerated.  At this time, patient very unsteady throughout session, requires hands on assist at all times. Will benefit from HHPT upon discharge. Spouse present during session and educated on need for hands on assist at all times.    Follow Up Recommendations Home health PT;Supervision/Assistance - 24 hour    Equipment Recommendations  Rolling walker with 5" wheels    Recommendations for Other Services       Precautions / Restrictions Precautions Precautions: Fall;Back Precaution Booklet Issued: Yes (comment) Precaution Comments: reviewed with patient Required Braces or Orthoses: Spinal Brace Spinal Brace: Lumbar corset      Mobility  Bed Mobility Overal bed mobility: Needs Assistance Bed Mobility: Rolling;Sidelying to Sit Rolling: Min assist Sidelying to sit: Min assist       General bed mobility comments: VCs for technique and sequencing, min assist for rolling and elevation to upright, increased time and eoffort  Transfers Overall transfer level: Needs assistance Equipment used: 1 person hand held assist;Rolling walker (2 wheeled) Transfers: Sit to/from Stand Sit to Stand: Min assist         General transfer comment: min assist for stability, posterior instability noted.   Ambulation/Gait Ambulation/Gait assistance: Min assist;Mod assist(multiple LOB requring moderate assist to  maintain upright) Ambulation Distance (Feet): 160 Feet Assistive device: Rolling walker (2 wheeled) Gait Pattern/deviations: Step-through pattern;Decreased stride length;Ataxic;Trunk flexed;Staggering right;Staggering left Gait velocity: decreased Gait velocity interpretation: Below normal speed for age/gender General Gait Details: patient with noted instability during ambulation, several significant LOB noted. Patient very unsteady and ataxic with mobility  Stairs Stairs: Yes Stairs assistance: Mod assist Stair Management: One rail Left;Step to pattern Number of Stairs: 5 General stair comments: moderate assist for stability on stairs, patient with decreased awareness and recpetivity to cues.  Wheelchair Mobility    Modified Rankin (Stroke Patients Only)       Balance                                             Pertinent Vitals/Pain      Home Living Family/patient expects to be discharged to:: Private residence Living Arrangements: Spouse/significant other Available Help at Discharge: Family;Available 24 hours/day Type of Home: House Home Access: Stairs to enter Entrance Stairs-Rails: None Entrance Stairs-Number of Steps: 3 Home Layout: One level Home Equipment: Walker - 2 wheels;Bedside commode;Shower seat;Grab bars - toilet;Grab bars - tub/shower      Prior Function Level of Independence: Independent               Hand Dominance   Dominant Hand: Right    Extremity/Trunk Assessment   Upper Extremity Assessment Upper Extremity Assessment: Defer to OT evaluation    Lower Extremity Assessment Lower Extremity Assessment: Generalized weakness(Decreased coordination )    Cervical / Trunk Assessment Cervical / Trunk Assessment: (s/p spinal surgery)  Communication   Communication: No difficulties  Cognition Arousal/Alertness: Awake/alert Behavior  During Therapy: Flat affect Overall Cognitive Status: Impaired/Different from  baseline Area of Impairment: Attention;Memory;Following commands;Safety/judgement;Awareness;Problem solving                   Current Attention Level: Sustained Memory: Decreased recall of precautions;Decreased short-term memory Following Commands: Follows one step commands with increased time Safety/Judgement: Decreased awareness of safety;Decreased awareness of deficits Awareness: Emergent Problem Solving: Slow processing;Decreased initiation;Difficulty sequencing;Requires verbal cues;Requires tactile cues        General Comments      Exercises     Assessment/Plan    PT Assessment Patient needs continued PT services  PT Problem List Decreased strength;Decreased activity tolerance;Decreased balance;Decreased mobility;Decreased coordination;Decreased cognition;Decreased safety awareness;Decreased knowledge of precautions;Pain       PT Treatment Interventions DME instruction;Gait training;Stair training;Functional mobility training;Therapeutic activities;Therapeutic exercise;Balance training;Patient/family education    PT Goals (Current goals can be found in the Care Plan section)  Acute Rehab PT Goals Patient Stated Goal: to go home PT Goal Formulation: With patient/family Time For Goal Achievement: 04/04/17 Potential to Achieve Goals: Good    Frequency Min 5X/week   Barriers to discharge        Co-evaluation               AM-PAC PT "6 Clicks" Daily Activity  Outcome Measure Difficulty turning over in bed (including adjusting bedclothes, sheets and blankets)?: Unable Difficulty moving from lying on back to sitting on the side of the bed? : Unable Difficulty sitting down on and standing up from a chair with arms (e.g., wheelchair, bedside commode, etc,.)?: Unable Help needed moving to and from a bed to chair (including a wheelchair)?: A Little Help needed walking in hospital room?: A Lot Help needed climbing 3-5 steps with a railing? : A Lot 6 Click  Score: 10    End of Session Equipment Utilized During Treatment: Back brace Activity Tolerance: Patient limited by fatigue Patient left: in bed;with call bell/phone within reach;with family/visitor present(sitting EOB ) Nurse Communication: Mobility status PT Visit Diagnosis: Unsteadiness on feet (R26.81);Muscle weakness (generalized) (M62.81);Ataxic gait (R26.0)    Time: 5366-4403 PT Time Calculation (min) (ACUTE ONLY): 18 min   Charges:   PT Evaluation $PT Eval Moderate Complexity: 1 Mod     PT G Codes:   PT G-Codes **NOT FOR INPATIENT CLASS** Functional Assessment Tool Used: Clinical judgement Functional Limitation: Mobility: Walking and moving around Mobility: Walking and Moving Around Current Status (K7425): At least 20 percent but less than 40 percent impaired, limited or restricted Mobility: Walking and Moving Around Goal Status [phone]): At least 1 percent but less than 20 percent impaired, limited or restricted    Alben Deeds, PT DPT  Board Certified Neurologic Specialist [place] 03/21/2017, 9:26 AM

## 2017-03-21 NOTE — Progress Notes (Addendum)
Pt seen and examined. No issues overnight.  EXAM: Temp:  [97.2 F (36.2 C)-97.8 F (36.6 C)] 97.4 F (36.3 C) (11/06 0400) Pulse Rate:  [48-74] 69 (11/06 0727) Resp:  [8-52] 18 (11/06 0727) BP: (81-144)/(50-74) 96/61 (11/06 0727) SpO2:  [95 %-100 %] 95 % (11/06 0727) Arterial Line BP: (99-147)/(46-66) 130/60 (11/05 1933) Intake/Output      11/05 0701 - 11/06 0700 11/06 0701 - 11/07 0700   P.O.  360   I.V. (mL/kg) 2550 (33.8)    Blood 1130    Other 250    IV Piggyback 500    Total Intake(mL/kg) 4430 (58.7) 360 (4.8)   Urine (mL/kg/hr) 1300 (0.7)    Drains 1300 150   Blood 1000    Total Output 3600 150   Net +830 +210         Awake and alert Follows commands throughout Full strength  Acute blood loss anemia, better after transfusion Ambulate with therapy Keep drain for now

## 2017-03-21 NOTE — Evaluation (Signed)
Occupational Therapy Evaluation Patient Details Name: Amanda Warner MRN: [id number] DOB: Oct 02, 1934 Today's Date: 03/21/2017    History of Present Illness Patient is a 82 yo female s/p L2-5 anterior interbody fusion via transpsoas approach; L2-5 laminectomy for decompression with L4-5 Gill procedure, L2-5 posterior segmental instrumented fusion   Clinical Impression   PTA, pt was living with her husband and was independent. Currently, pt requires Min A for ADLs and functional mobility using RW. Provided education on LB ADLs with AE and compensatory techniques for toilet hygiene; pt demonstrated and verbalized understanding. Pt would benefit form further acute OT to address toilet and shower transfers. Recommend dc home with HHOT to optimize safety and independence with ADLs and functional mobility as well as decreased caregiver burden.     Follow Up Recommendations  Home health OT;Supervision/Assistance - 24 hour    Equipment Recommendations  None recommended by OT    Recommendations for Other Services PT consult     Precautions / Restrictions Precautions Precautions: Fall;Back Precaution Booklet Issued: Yes (comment) Precaution Comments: reviewed with patient Required Braces or Orthoses: Spinal Brace Spinal Brace: Lumbar corset      Mobility Bed Mobility Overal bed mobility: Needs Assistance Bed Mobility: Rolling;Sidelying to Sit Rolling: Min assist Sidelying to sit: Min assist       General bed mobility comments: At EOB upon arrival  Transfers Overall transfer level: Needs assistance Equipment used: Rolling walker (2 wheeled) Transfers: Sit to/from Stand Sit to Stand: Min assist         General transfer comment: min assist for stability, posterior instability noted.     Balance Overall balance assessment: Needs assistance Sitting-balance support: No upper extremity supported;Feet supported Sitting balance-Leahy Scale: Fair Sitting balance - Comments: posterior  lean in sitting at EOB during LB ADLs. No LOB  Postural control: Posterior lean Standing balance support: During functional activity;No upper extremity supported Standing balance-Leahy Scale: Poor Standing balance comment: Pt requiring Min A to maintain standing balance while pulling pants over hips. Pt with posterior lean                           ADL either performed or assessed with clinical judgement   ADL Overall ADL's : Needs assistance/impaired Eating/Feeding: Set up;Sitting   Grooming: Set up;Sitting   Upper Body Bathing: Minimal assistance;With caregiver independent assisting;Sitting   Lower Body Bathing: Minimal assistance;With adaptive equipment;Sit to/from stand;With caregiver independent assisting   Upper Body Dressing : Minimal assistance;With caregiver independent assisting;Sitting   Lower Body Dressing: Minimal assistance;With adaptive equipment;With caregiver independent assisting;Sit to/from stand Lower Body Dressing Details (indicate cue type and reason): Pt donning underwear and pants with Min A to manage AE and for balance in standing. Pt with posterior lean in standing during dynamic movements. Pt demonstrating understanding of AE. Provided handout for AE and feel pt would benefit greatly from use.  Toilet Transfer: Minimal assistance;Ambulation;RW(Simulated to recliner) Toilet Transfer Details (indicate cue type and reason): Discussed possibly need for arm rests (with BSC) over toilet at home. Will address at next session   Toileting - Clothing Manipulation Details (indicate cue type and reason): Discussed toilet hygiene techniques. Pt and husband verbalized understanding.      Functional mobility during ADLs: Min guard;Rolling walker General ADL Comments: Provided pt with education on LB ADLs with AE and toilet hygiene. Pt and husband verbalizing understanding.  Pt dmeonstrating decreased sequencing of task and poor coorindation.      Vision  Perception     Praxis      Pertinent Vitals/Pain Pain Assessment: Faces Faces Pain Scale: Hurts little more Pain Location: Back Pain Descriptors / Indicators: Constant;Discomfort;Operative site guarding Pain Intervention(s): Monitored during session;Repositioned;Limited activity within patient's tolerance     Hand Dominance Right   Extremity/Trunk Assessment Upper Extremity Assessment Upper Extremity Assessment: Generalized weakness   Lower Extremity Assessment Lower Extremity Assessment: Defer to PT evaluation   Cervical / Trunk Assessment Cervical / Trunk Assessment: Other exceptions(s/p spinal surgery)   Communication Communication Communication: No difficulties   Cognition Arousal/Alertness: Awake/alert Behavior During Therapy: WFL for tasks assessed/performed Overall Cognitive Status: Impaired/Different from baseline Area of Impairment: Attention;Memory;Following commands;Safety/judgement;Awareness;Problem solving                   Current Attention Level: Sustained Memory: Decreased recall of precautions;Decreased short-term memory Following Commands: Follows one step commands with increased time Safety/Judgement: Decreased awareness of safety;Decreased awareness of deficits Awareness: Emergent Problem Solving: Slow processing;Difficulty sequencing;Requires verbal cues;Requires tactile cues General Comments: Pt requiring increased time and cues for sequencing of tasks. Pt with decreased awarn4ess, problem solving, and following of commands.    General Comments  Husband present throughout session    Exercises     Shoulder Instructions      Home Living Family/patient expects to be discharged to:: Private residence Living Arrangements: Spouse/significant other Available Help at Discharge: Family;Available 24 hours/day Type of Home: House Home Access: Stairs to enter [employer] of Steps: 3 Entrance Stairs-Rails: None Home Layout: One  level     Bathroom Shower/Tub: Tub/shower unit;Walk-in shower   Bathroom Toilet: Standard(with risers) Bathroom Accessibility: (y)   Home Equipment: Walker - 2 wheels;Bedside commode;Shower seat;Grab bars - toilet;Grab bars - tub/shower          Prior Functioning/Environment Level of Independence: Independent                 OT Problem List: Decreased strength;Decreased range of motion;Decreased activity tolerance;Impaired balance (sitting and/or standing);Decreased coordination;Decreased knowledge of use of DME or AE;Decreased knowledge of precautions;Pain      OT Treatment/Interventions: Self-care/ADL training;Therapeutic exercise;Energy conservation;DME and/or AE instruction;Therapeutic activities;Patient/family education    OT Goals(Current goals can be found in the care plan section) Acute Rehab OT Goals Patient Stated Goal: to go home OT Goal Formulation: With patient/family Time For Goal Achievement: 04/04/17 Potential to Achieve Goals: Good ADL Goals Pt Will Perform Lower Body Dressing: with supervision;with set-up;sit to/from stand;with adaptive equipment Pt Will Transfer to Toilet: with supervision;with set-up;ambulating(elevated toilet) Pt Will Perform Toileting - Clothing Manipulation and hygiene: with set-up;with supervision;sit to/from stand Pt Will Perform Tub/Shower Transfer: Shower transfer;with supervision;with set-up;rolling walker;shower seat;ambulating Additional ADL Goal #1: pt will independently verbalize 3/3 back precaution  OT Frequency: Min 2X/week   Barriers to D/C:            Co-evaluation              AM-PAC PT "6 Clicks" Daily Activity     Outcome Measure Help from another person eating meals?: None Help from another person taking care of personal grooming?: A Little Help from another person toileting, which includes using toliet, bedpan, or urinal?: A Little Help from another person bathing (including washing, rinsing, drying)?:  A Lot Help from another person to put on and taking off regular upper body clothing?: A Little Help from another person to put on and taking off regular lower body clothing?: A Lot 6 Click Score: 17   End of  Session Equipment Utilized During Treatment: Gait belt;Back brace;Rolling walker Nurse Communication: Mobility status;Other (comment)(Need HHOT)  Activity Tolerance: Patient tolerated treatment well Patient left: in chair;with call bell/phone within reach  OT Visit Diagnosis: Unsteadiness on feet (R26.81);Other abnormalities of gait and mobility (R26.89);Muscle weakness (generalized) (M62.81);Ataxia, unspecified (R27.0);Pain Pain - Right/Left: (Back) Pain - part of body: (Back)                Time: 9390-3009 OT Time Calculation (min): 22 min Charges:  OT General Charges $OT Visit: 1 Visit OT Evaluation $OT Eval Moderate Complexity: 1 Mod G-Codes:     [place] MSOT, OTR/L Acute Rehab Pager: [phone] Office: [place] 03/21/2017, 10:45 AM

## 2017-03-22 ENCOUNTER — Encounter (HOSPITAL_COMMUNITY): Payer: Self-pay | Admitting: Neurological Surgery

## 2017-03-22 LAB — GLUCOSE, CAPILLARY
Glucose-Capillary: 105 mg/dL — ABNORMAL HIGH (ref 65–99)
Glucose-Capillary: 133 mg/dL — ABNORMAL HIGH (ref 65–99)

## 2017-03-22 LAB — CBC
HCT: 28.6 % — ABNORMAL LOW (ref 36.0–46.0)
Hemoglobin: 9.2 g/dL — ABNORMAL LOW (ref 12.0–15.0)
MCH: 27.4 pg (ref 26.0–34.0)
MCHC: 32.2 g/dL (ref 30.0–36.0)
MCV: 85.1 fL (ref 78.0–100.0)
Platelets: 90 10*3/uL — ABNORMAL LOW (ref 150–400)
RBC: 3.36 MIL/uL — ABNORMAL LOW (ref 3.87–5.11)
RDW: 15.2 % (ref 11.5–15.5)
WBC: 8 10*3/uL (ref 4.0–10.5)

## 2017-03-22 MED ORDER — DIAZEPAM 5 MG PO TABS: 5.0000 mg | ORAL_TABLET | Freq: Four times a day (QID) | ORAL | 0 refills | Status: DC | PRN

## 2017-03-22 MED ORDER — GABAPENTIN 300 MG PO CAPS: 300.0000 mg | ORAL_CAPSULE | Freq: Three times a day (TID) | ORAL | 2 refills | Status: AC

## 2017-03-22 MED FILL — Heparin Sodium (Porcine) Inj 1000 Unit/ML: INTRAMUSCULAR | Qty: 60 | Status: AC

## 2017-03-22 MED FILL — Sodium Chloride IV Soln 0.9%: INTRAVENOUS | Qty: 4000 | Status: AC

## 2017-03-22 NOTE — Plan of Care (Signed)
  Progressing Activity: Ability to avoid complications of mobility impairment will improve 03/22/2017 0617 - Progressing by Jonnie Finner, RN Ability to tolerate increased activity will improve 03/22/2017 0617 - Progressing by Jonnie Finner, RN Will remain free from falls 03/22/2017 0617 - Progressing by Jonnie Finner, RN Education: Ability to verbalize activity precautions or restrictions will improve 03/22/2017 0617 - Progressing by Jonnie Finner, RN Knowledge of the prescribed therapeutic regimen will improve 03/22/2017 0617 - Progressing by Jonnie Finner, RN Understanding of discharge needs will improve 03/22/2017 0617 - Progressing by Jonnie Finner, RN Physical Regulation: Ability to maintain clinical measurements within normal limits will improve 03/22/2017 0617 - Progressing by Jonnie Finner, RN Postoperative complications will be avoided or minimized 03/22/2017 0617 - Progressing by Jonnie Finner, RN Pain Management: Pain level will decrease 03/22/2017 0617 - Progressing by Jonnie Finner, RN Skin Integrity: Signs of wound healing will improve 03/22/2017 0617 - Progressing by Jonnie Finner, RN Health Behavior/Discharge Planning: Identification of resources available to assist in meeting health care needs will improve 03/22/2017 0617 - Progressing by Jonnie Finner, RN

## 2017-03-22 NOTE — Progress Notes (Signed)
Patient alert and oriented, mae's well, voiding adequate amount of urine, swallowing without difficulty, no c/o pain at time of discharge. Patient discharged home with family. Script and discharged instructions given to patient. Patient and family stated understanding of instructions given. Patient has an appointment with Dr. Ditty  

## 2017-03-22 NOTE — Discharge Summary (Signed)
Physician Discharge Summary  Patient ID: Amanda Warner MRN: [id number] DOB/AGE: 1934-08-05 82 y.o.  Admit date: 03/20/2017 Discharge date: 03/22/2017  Admission Diagnoses:  Lumbar spondylolisthesis  Discharge Diagnoses:  Same Active Problems:   Spondylolisthesis of lumbosacral region   Discharged Condition: Stable  Hospital Course:  Amanda Warner is a 82 y.o. female who was admitted for the below procedure. Post op complicated by acute blood loss anemia. Improved at time of discharge. At time of discharge, pain was well controlled, ambulating with Pt/OT, tolerating po, voiding normal. Ready for discharge.  Treatments: Surgery Lumbar two-five Transpsoas lumbar interbody fusion, Lumbar two-five laminectomy for decompression with posterior segmental instrumented fusion/[place] (N/A)  APPLICATION OF ROBOTIC ASSISTANCE FOR SPINAL PROCEDURE (N/A)   Discharge Exam: Blood pressure (!) 88/58, pulse 66, temperature 97.8 F (36.6 C), temperature source Oral, resp. rate 18, height 5\' 5"  (1.651 m), weight 75.5 kg (166 lb 6.4 oz), SpO2 97 %. Awake, alert, oriented Speech fluent, appropriate CN grossly intact MAEW Wound with dried blood  Disposition: 01-Home or Self Care with [facility]  Discharge Instructions    Call MD for:  difficulty breathing, headache or visual disturbances   Complete by:  As directed    Call MD for:  persistant dizziness or light-headedness   Complete by:  As directed    Call MD for:  redness, tenderness, or signs of infection (pain, swelling, redness, odor or green/yellow discharge around incision site)   Complete by:  As directed    Call MD for:  severe uncontrolled pain   Complete by:  As directed    Call MD for:  temperature >100.4   Complete by:  As directed    Diet general   Complete by:  As directed    Driving Restrictions   Complete by:  As directed    Do not drive until given clearance.   Increase activity slowly   Complete by:  As directed    Lifting  restrictions   Complete by:  As directed    Do not lift anything >10lbs. Avoid bending and twisting in awkward positions. Avoid bending at the back.   May shower / Bathe   Complete by:  As directed    In 24 hours. Okay to wash wound with warm soapy water. Avoid scrubbing the wound. Pat dry.   Remove dressing in 24 hours   Complete by:  As directed      Allergies as of 03/22/2017      Reactions   Penicillins Anaphylaxis, Swelling   Allopurinol Other (See Comments)   Liver damage   Codeine Hives, Other (See Comments)   Sedation    Doxycycline Hives   Sudafed [pseudoephedrine Hcl] Hives   Sulfa Antibiotics Hives   Tramadol Hives      Medication List    STOP taking these medications   enoxaparin 40 MG/0.4ML injection Commonly known as:  LOVENOX   methocarbamol 500 MG tablet Commonly known as:  ROBAXIN     TAKE these medications   acetaminophen 500 MG tablet Commonly known as:  TYLENOL Take 500 mg by mouth every 6 (six) hours as needed for moderate pain or headache.   diazepam 5 MG tablet Commonly known as:  VALIUM Take 1 tablet (5 mg total) every 6 (six) hours as needed by mouth for muscle spasms.   diphenhydrAMINE 25 mg capsule Commonly known as:  BENADRYL Take 25 mg by mouth at bedtime.   febuxostat 40 MG tablet Commonly known as:  ULORIC Take 40 mg  by mouth every other day.   furosemide 20 MG tablet Commonly known as:  LASIX Take 20 mg by mouth daily.   gabapentin 300 MG capsule Commonly known as:  NEURONTIN Take 300 mg by mouth at bedtime. What changed:  Another medication with the same name was added. Make sure you understand how and when to take each.   gabapentin 300 MG capsule Commonly known as:  NEURONTIN Take 1 capsule (300 mg total) 3 (three) times daily by mouth. What changed:  You were already taking a medication with the same name, and this prescription was added. Make sure you understand how and when to take each.   HYDROmorphone 4 MG  tablet Commonly known as:  DILAUDID Take 1 tablet (4 mg total) by mouth every 3 (three) hours as needed for severe pain.   levothyroxine 50 MCG tablet Commonly known as:  SYNTHROID, LEVOTHROID Take 50 mcg by mouth daily before breakfast.   metoCLOPramide 5 MG tablet Commonly known as:  REGLAN Take 1 tablet (5 mg total) by mouth every 6 (six) hours as needed for nausea (if ondansetron (ZOFRAN) ineffective.).   polyethylene glycol packet Commonly known as:  MIRALAX / GLYCOLAX Take 17 g by mouth 2 (two) times daily.   potassium chloride 10 MEQ tablet Commonly known as:  K-DUR Take 10 mEq by mouth every other day.   Vitamin D (Ergocalciferol) 50000 units Caps capsule Commonly known as:  DRISDOL Take 50,000 Units by mouth every 14 (fourteen) days.      Follow-up Information    Ditty, Kevan Ny, MD Follow up.   Specialty:  Neurosurgery Contact information: [address] [place] [address] [phone]           Signed: Traci Sermon 03/22/2017, 7:58 AM

## 2017-03-22 NOTE — Progress Notes (Signed)
Occupational Therapy Treatment Patient Details Name: Amanda Warner MRN: [id number] DOB: November 11, 1934 Today's Date: 03/22/2017    History of present illness Patient is a 82 yo female s/p L2-5 anterior interbody fusion via transpsoas approach; L2-5 laminectomy for decompression with L4-5 Gill procedure, L2-5 posterior segmental instrumented fusion   OT comments  Pt progressing towards established goals and demonstrating increased balance and cognition compared to prior session. Providing education on toilet and shower transfer. Pt performing toileting and simulated shower transfer with Min for standing balance. Provided all education and answered pt questions in preparation for possible dc later today. Continue to recommend dc with HHOT to optimize safety and independence with ADls and functional mobility.    Follow Up Recommendations  Home health OT;Supervision/Assistance - 24 hour    Equipment Recommendations  None recommended by OT    Recommendations for Other Services PT consult    Precautions / Restrictions Precautions Precautions: Fall;Back Precaution Booklet Issued: Yes (comment) Precaution Comments: reviewed with patient Required Braces or Orthoses: Spinal Brace Spinal Brace: Lumbar corset Restrictions Weight Bearing Restrictions: No       Mobility Bed Mobility Overal bed mobility: Needs Assistance Bed Mobility: Rolling;Sidelying to Sit Rolling: Supervision Sidelying to sit: Supervision       General bed mobility comments: In recliner upon arrival  Transfers Overall transfer level: Needs assistance Equipment used: Rolling walker (2 wheeled) Transfers: Sit to/from Stand Sit to Stand: Supervision         General transfer comment: supervision for safety     Balance Overall balance assessment: Needs assistance Sitting-balance support: No upper extremity supported;Feet supported Sitting balance-Leahy Scale: Fair Sitting balance - Comments: posterior lean in  sitting at EOB during LB ADLs. No LOB  Postural control: Posterior lean Standing balance support: During functional activity;No upper extremity supported Standing balance-Leahy Scale: Poor Standing balance comment: continues to require UE assist                           ADL either performed or assessed with clinical judgement   ADL Overall ADL's : Needs assistance/impaired     Grooming: Wash/dry hands;Min guard;Standing Grooming Details (indicate cue type and reason): Discussed RW management nad positioning at sink. Min Guard for safety during [employer]: Ambulation;RW;BSC;Minimal assistance Toilet Transfer Details (indicate cue type and reason): Pt performing toilet transfer with Min A for balance and stability. Pt reporting that her husband borrowed a BSC from a friend and has placed it over the toilet at home [place] and Hygiene: Minimal assistance;Sit to/from stand Toileting - Clothing Manipulation Details (indicate cue type and reason): Min A for standing balance with post lean Tub/ Shower Transfer: Walk-in shower;Minimal assistance;Ambulation;Rolling walker Tub/Shower Transfer Details (indicate cue type and reason): Simulated shower transfer with Min A for stability.  Functional mobility during ADLs: Min guard;Rolling walker General ADL Comments: Provided pt with education on LB ADLs with AE and toilet hygiene. Pt and husband verbalizing understanding.  Pt dmeonstrating decreased sequencing of task and poor coorindation.      Vision       Perception     Praxis      Cognition Arousal/Alertness: Awake/alert Behavior During Therapy: WFL for tasks assessed/performed Overall Cognitive Status: Within Functional Limits for tasks assessed  General Comments: Pt demonstrating increased processing and follow commands today.         Exercises     Shoulder  Instructions       General Comments      Pertinent Vitals/ Pain       Pain Assessment: Faces Faces Pain Scale: Hurts little more Pain Location: bilateral hips Pain Descriptors / Indicators: Sore Pain Intervention(s): Monitored during session;Repositioned  Home Living                                          Prior Functioning/Environment              Frequency  Min 2X/week        Progress Toward Goals  OT Goals(current goals can now be found in the care plan section)  Progress towards OT goals: Progressing toward goals  Acute Rehab OT Goals Patient Stated Goal: to go home OT Goal Formulation: With patient/family Time For Goal Achievement: 04/04/17 Potential to Achieve Goals: Good ADL Goals Pt Will Perform Lower Body Dressing: with supervision;with set-up;sit to/from stand;with adaptive equipment Pt Will Transfer to Toilet: with supervision;with set-up;ambulating(elevated toilet) Pt Will Perform Toileting - Clothing Manipulation and hygiene: with set-up;with supervision;sit to/from stand Pt Will Perform Tub/Shower Transfer: Shower transfer;with supervision;with set-up;rolling walker;shower seat;ambulating Additional ADL Goal #1: pt will independently verbalize 3/3 back precaution  Plan Discharge plan remains appropriate    Co-evaluation                 AM-PAC PT "6 Clicks" Daily Activity     Outcome Measure   Help from another person eating meals?: None Help from another person taking care of personal grooming?: A Little Help from another person toileting, which includes using toliet, bedpan, or urinal?: A Little Help from another person bathing (including washing, rinsing, drying)?: A Lot Help from another person to put on and taking off regular upper body clothing?: A Little Help from another person to put on and taking off regular lower body clothing?: A Lot 6 Click Score: 17    End of Session Equipment Utilized During Treatment:  Gait belt;Back brace;Rolling walker  OT Visit Diagnosis: Unsteadiness on feet (R26.81);Other abnormalities of gait and mobility (R26.89);Muscle weakness (generalized) (M62.81);Ataxia, unspecified (R27.0);Pain Pain - Right/Left: (Back) Pain - part of body: (Back)   Activity Tolerance Patient tolerated treatment well   Patient Left in chair;with call bell/phone within reach   Nurse Communication Mobility status        Time: 6759-1638 OT Time Calculation (min): 20 min  Charges: OT General Charges $OT Visit: 1 Visit OT Treatments $Self Care/Home Management : 8-22 mins  [place], OTR/L Acute Rehab Pager: [phone] Office: [place] 03/22/2017, 11:54 AM

## 2017-03-22 NOTE — Progress Notes (Signed)
Physical Therapy Treatment Patient Details Name: Amanda Warner MRN: [id number] DOB: [birth year] Today's Date: 03/22/2017    History of Present Illness Patient is a 82 yo female s/p L2-5 anterior interbody fusion via transpsoas approach; L2-5 laminectomy for decompression with L4-5 Gill procedure, L2-5 posterior segmental instrumented fusion    PT Comments    Patient progressing with mobility, improvements noted in cognition and mobility today. Tolerated increased activity today with improvements in stability noted. Will continue current POC.   Follow Up Recommendations  Home health PT;Supervision/Assistance - 24 hour     Equipment Recommendations  Rolling walker with 5" wheels    Recommendations for Other Services       Precautions / Restrictions Precautions Precautions: Fall;Back Precaution Booklet Issued: Yes (comment) Precaution Comments: reviewed with patient Required Braces or Orthoses: Spinal Brace Spinal Brace: Lumbar corset Restrictions Weight Bearing Restrictions: No    Mobility  Bed Mobility Overal bed mobility: Needs Assistance Bed Mobility: Rolling;Sidelying to Sit Rolling: Supervision Sidelying to sit: Supervision       General bed mobility comments: Supervision to come to EOB, VCs for technique and compliance  Transfers Overall transfer level: Needs assistance Equipment used: Rolling walker (2 wheeled) Transfers: Sit to/from Stand Sit to Stand: Supervision         General transfer comment: supervision for safety   Ambulation/Gait Ambulation/Gait assistance: Min guard Ambulation Distance (Feet): 340 Feet Assistive device: Rolling walker (2 wheeled) Gait Pattern/deviations: Step-through pattern;Decreased stride length;Ataxic;Trunk flexed;Staggering right;Staggering left Gait velocity: decreased   General Gait Details: improvements in    Stairs            Wheelchair Mobility    Modified Rankin (Stroke Patients Only)       Balance  Overall balance assessment: Needs assistance Sitting-balance support: No upper extremity supported;Feet supported Sitting balance-Leahy Scale: Fair     Standing balance support: During functional activity;No upper extremity supported Standing balance-Leahy Scale: Poor Standing balance comment: continues to require UE assist                            Cognition Arousal/Alertness: Awake/alert Behavior During Therapy: WFL for tasks assessed/performed Overall Cognitive Status: No family/caregiver present to determine baseline cognitive functioning                                 General Comments: improved cognition today      Exercises      General Comments        Pertinent Vitals/Pain Pain Assessment: Faces Faces Pain Scale: Hurts little more Pain Location: bilateral hips Pain Descriptors / Indicators: Sore Pain Intervention(s): Monitored during session    Home Living                      Prior Function            PT Goals (current goals can now be found in the care plan section) Acute Rehab PT Goals Patient Stated Goal: to go home PT Goal Formulation: With patient/family Time For Goal Achievement: 04/04/17 Potential to Achieve Goals: Good Progress towards PT goals: Progressing toward goals    Frequency    Min 5X/week      PT Plan Current plan remains appropriate    Co-evaluation              AM-PAC PT "6 Clicks" Daily Activity  Outcome Measure  Difficulty  turning over in bed (including adjusting bedclothes, sheets and blankets)?: Unable Difficulty moving from lying on back to sitting on the side of the bed? : Unable Difficulty sitting down on and standing up from a chair with arms (e.g., wheelchair, bedside commode, etc,.)?: Unable Help needed moving to and from a bed to chair (including a wheelchair)?: A Little Help needed walking in hospital room?: A Lot Help needed climbing 3-5 steps with a railing? : A Lot 6  Click Score: 10    End of Session Equipment Utilized During Treatment: Back brace Activity Tolerance: Patient limited by fatigue Patient left: in chair;with call bell/phone within reach(sitting EOB ) Nurse Communication: Mobility status PT Visit Diagnosis: Unsteadiness on feet (R26.81);Muscle weakness (generalized) (M62.81);Ataxic gait (R26.0)     Time: 9470-9628 PT Time Calculation (min) (ACUTE ONLY): 17 min  Charges:  $Gait Training: 8-22 mins                       Alben Deeds, PT DPT  Board Certified Neurologic Specialist [place] 03/22/2017, 8:30 AM

## 2017-03-22 NOTE — Care Management Note (Signed)
Case Management Note  Patient Details  Name: Amanda Warner MRN: [id number] Date of Birth: 1934/09/04  Subjective/Objective:                 Spoke with patient at the bedside. She states she has DME RW 3/1, and had used [place] ([facility]) in the past, she would like to use them again. Referral made to Cleotilde Neer.    Action/Plan:  DC to hme w [place], CM signing off.  Expected Discharge Date:  03/22/17               Expected Discharge Plan:  [place]  In-House Referral:     Discharge planning Services  CM Consult  Post Acute Care Choice:  Home Health Choice offered to:  Patient  DME Arranged:    DME Agency:     HH Arranged:  OT, PT [place] Agency:  [facility] (now [facility])  Status of Service:  Completed, signed off  If discussed at [place] of Stay Meetings, dates discussed:    Additional Comments:  Carles Collet, RN 03/22/2017, 8:15 AM

## 2017-03-24 DIAGNOSIS — Z96653 Presence of artificial knee joint, bilateral: Secondary | ICD-10-CM | POA: Diagnosis not present

## 2017-03-24 DIAGNOSIS — Z981 Arthrodesis status: Secondary | ICD-10-CM | POA: Diagnosis not present

## 2017-03-24 DIAGNOSIS — M4317 Spondylolisthesis, lumbosacral region: Secondary | ICD-10-CM | POA: Diagnosis not present

## 2017-03-24 DIAGNOSIS — Z4789 Encounter for other orthopedic aftercare: Secondary | ICD-10-CM | POA: Diagnosis not present

## 2017-03-27 DIAGNOSIS — D500 Iron deficiency anemia secondary to blood loss (chronic): Secondary | ICD-10-CM | POA: Diagnosis not present

## 2017-03-27 DIAGNOSIS — M4320 Fusion of spine, site unspecified: Secondary | ICD-10-CM | POA: Diagnosis not present

## 2017-03-27 DIAGNOSIS — I129 Hypertensive chronic kidney disease with stage 1 through stage 4 chronic kidney disease, or unspecified chronic kidney disease: Secondary | ICD-10-CM | POA: Diagnosis not present

## 2017-03-27 DIAGNOSIS — M2550 Pain in unspecified joint: Secondary | ICD-10-CM | POA: Diagnosis not present

## 2017-03-27 DIAGNOSIS — R0602 Shortness of breath: Secondary | ICD-10-CM | POA: Diagnosis not present

## 2017-03-27 DIAGNOSIS — R609 Edema, unspecified: Secondary | ICD-10-CM | POA: Diagnosis not present

## 2017-03-27 DIAGNOSIS — R635 Abnormal weight gain: Secondary | ICD-10-CM | POA: Diagnosis not present

## 2017-03-27 DIAGNOSIS — N183 Chronic kidney disease, stage 3 (moderate): Secondary | ICD-10-CM | POA: Diagnosis not present

## 2017-03-27 DIAGNOSIS — E039 Hypothyroidism, unspecified: Secondary | ICD-10-CM | POA: Diagnosis not present

## 2017-03-27 DIAGNOSIS — E1122 Type 2 diabetes mellitus with diabetic chronic kidney disease: Secondary | ICD-10-CM | POA: Diagnosis not present

## 2017-03-27 NOTE — Op Note (Signed)
03/20/2017  12:53 PM  PATIENT:  Amanda Warner  82 y.o. female  PRE-OPERATIVE DIAGNOSIS:  Lumbosacral spondylosis with radiculopathy; spondylolisthesis of lumbosacral region L4-5; lumbar stenosis L2-3, L3-4, L4-5  POST-OPERATIVE DIAGNOSIS:  Same  PROCEDURE:  L2-3, L3-4, L4-5 anterior interbody fusion via transpsoas approach, use of BMP; L2-3, L3-4, L4-5 laminectomy for decompression with L4-5 Gill procedure; L2-5 posterior segmental instrumented fusion  SURGEON:  Aldean Ast, MD  ASSISTANTS: Newman Pies, MD  ANESTHESIA:   General  DRAINS: Medium hemovac   SPECIMEN:  None  INDICATION FOR PROCEDURE: 82 year old woman with back and leg pain that did not improve with medical management.  I recommended the above procedure.  The patient understood the risks, benefits, and alternatives and potential outcomes and wished to proceed.  PROCEDURE DETAILS: The patient was brought to the operating room.  After smooth induction of general endotracheal anesthesia the patient was placed in the lateral position with the right side up and secured with tape.  Localizing views were taken with fluoroscopy.  The operative site was then prepped and draped in the usual sterile fashion.  The planned incisions were infiltrated with lidocaine with epinephrine.   The skin was sharply incised and soft tissue to the level of the oblique abdominal muscles was carried out with monopolar cautery.  I then bluntly dissected through the oblique muscles with care taken to preserve any nerves.  I encountered encountered transversalis fascia which was bluntly entered and this opened into an easily dissected fat plane consistent with the retroperitoneum.  I inserted the dilator and approached the posterior third of the L4-5 disc space.  I confirmed that I was not in contact with any nerves of the lumbar plexus. A K-wire was passed into the disc space.  The tract was sequentially dilated and then the retractor was introduced.   I incised the disc space and then passed a Cobb elevator along each endplate and penetrated the annulus on the contralateral side.  I then used box cutters, a paddle, and a rasp to complete the discectomy.  A trial spacer was used to determine appropriate graft size and then a PEEK lordotic interbody graft packed with BMP and grafton was inserted.  The retractor was removed after hemostasis was confirmed.   The retractor was then repositioned to L3-4.  Discectomy was again performed.  A second interbody graft packed with BMP and grafton was inserted.  Hemostasis was again confirmed and the retractor was removed.     A second incision was made for L2-3.  This process was repeated at L2-3.   Discectomy was performed and the spacer was placed without complication.   The incisions were closed in layers with interrupted vicryl sutures.  The skin was closed with dermabond.  The patient was then turned prone on an open [place] table  The skin of the lumbar area was clipped of hair and wiped out with alcohol. It was prepped and draped in the usual sterile fashion. The planned incision was injected with a mixture of lidocaine and Marcaine with epinephrine.  The skin was opened sharply and a subperiosteal dissection was performed to expose the lateral edges of the lamina of L2, L3, L4, and L5.  Subperiosteal dissection was performed over the L2-3, L3-4, and L4-5 facet joints bilaterally.    The [facility] robotic system was registered to the patient using fluoroscopy.  Using the [facility] as a drill guide pedicle screw tracts were then drilled at L2, L3, L4, and L5.  K-Wires were placed down the trajectories and I then tapped over the K-wires.  K-wires were removed and the trajectories were palpated with a ball tip probe and found to be competent.  I then resected the spinous processes of L2, L3, L4, and L5.  I used the high speed drill to drill a trough across the lamina and pars at L2, L3, and L4.  I then used an  osteotome to fracture the bone across the pars and separate the inferior articular process at each level.  This was separated from the underlying dura and ligament and removed from the surgical field.  The underlying ligament was elevated and resected.  Decompression was carried out to the lateral recesses with Kerrison rongeurs.  Screws were then placed to the appropriate depth down the cannulated tracts.  A lordotic rod was inserted and secured in the screw caps. Final tightening with a torque wrench was performed at all levels. The remaining facet joints and transverse processes from L2-L5 were decorticated with the high speed burr.  Fusion substrate was placed in the lateral gutters.  Meticulous hemostasis was obtained. The wound was irrigated with bacitracin saline. Exparel was injected into the paraspinous muscles.  A medium Hemovac drain was placed below the fascia. About 1 g of vancomycin powder was inserted into the wound. The wound was closed in routine anatomic layers. The skin was closed with a running subcuticular monocryl suture and then sealed with dermabond.   The patient was returned to the supine position and awoke without complication.  PATIENT DISPOSITION:  PACU - hemodynamically stable.   Delay start of Pharmacological VTE agent (>24hrs) due to surgical blood loss or risk of bleeding:  yes

## 2017-03-28 DIAGNOSIS — Z96653 Presence of artificial knee joint, bilateral: Secondary | ICD-10-CM | POA: Diagnosis not present

## 2017-03-28 DIAGNOSIS — Z981 Arthrodesis status: Secondary | ICD-10-CM | POA: Diagnosis not present

## 2017-03-28 DIAGNOSIS — M4317 Spondylolisthesis, lumbosacral region: Secondary | ICD-10-CM | POA: Diagnosis not present

## 2017-03-28 DIAGNOSIS — Z4789 Encounter for other orthopedic aftercare: Secondary | ICD-10-CM | POA: Diagnosis not present

## 2017-03-30 DIAGNOSIS — Z96653 Presence of artificial knee joint, bilateral: Secondary | ICD-10-CM | POA: Diagnosis not present

## 2017-03-30 DIAGNOSIS — M4317 Spondylolisthesis, lumbosacral region: Secondary | ICD-10-CM | POA: Diagnosis not present

## 2017-03-30 DIAGNOSIS — Z981 Arthrodesis status: Secondary | ICD-10-CM | POA: Diagnosis not present

## 2017-03-30 DIAGNOSIS — Z4789 Encounter for other orthopedic aftercare: Secondary | ICD-10-CM | POA: Diagnosis not present

## 2017-03-31 DIAGNOSIS — Z96653 Presence of artificial knee joint, bilateral: Secondary | ICD-10-CM | POA: Diagnosis not present

## 2017-03-31 DIAGNOSIS — M4317 Spondylolisthesis, lumbosacral region: Secondary | ICD-10-CM | POA: Diagnosis not present

## 2017-03-31 DIAGNOSIS — Z981 Arthrodesis status: Secondary | ICD-10-CM | POA: Diagnosis not present

## 2017-03-31 DIAGNOSIS — Z4789 Encounter for other orthopedic aftercare: Secondary | ICD-10-CM | POA: Diagnosis not present

## 2017-04-03 DIAGNOSIS — R0602 Shortness of breath: Secondary | ICD-10-CM | POA: Diagnosis not present

## 2017-04-03 DIAGNOSIS — M4320 Fusion of spine, site unspecified: Secondary | ICD-10-CM | POA: Diagnosis not present

## 2017-04-03 DIAGNOSIS — N183 Chronic kidney disease, stage 3 (moderate): Secondary | ICD-10-CM | POA: Diagnosis not present

## 2017-04-03 DIAGNOSIS — R609 Edema, unspecified: Secondary | ICD-10-CM | POA: Diagnosis not present

## 2017-04-03 DIAGNOSIS — E039 Hypothyroidism, unspecified: Secondary | ICD-10-CM | POA: Diagnosis not present

## 2017-04-03 DIAGNOSIS — E1122 Type 2 diabetes mellitus with diabetic chronic kidney disease: Secondary | ICD-10-CM | POA: Diagnosis not present

## 2017-04-03 DIAGNOSIS — M4317 Spondylolisthesis, lumbosacral region: Secondary | ICD-10-CM | POA: Diagnosis not present

## 2017-04-03 DIAGNOSIS — I129 Hypertensive chronic kidney disease with stage 1 through stage 4 chronic kidney disease, or unspecified chronic kidney disease: Secondary | ICD-10-CM | POA: Diagnosis not present

## 2017-04-03 DIAGNOSIS — R05 Cough: Secondary | ICD-10-CM | POA: Diagnosis not present

## 2017-04-03 DIAGNOSIS — D500 Iron deficiency anemia secondary to blood loss (chronic): Secondary | ICD-10-CM | POA: Diagnosis not present

## 2017-04-03 DIAGNOSIS — M2550 Pain in unspecified joint: Secondary | ICD-10-CM | POA: Diagnosis not present

## 2017-04-03 DIAGNOSIS — Z981 Arthrodesis status: Secondary | ICD-10-CM | POA: Diagnosis not present

## 2017-04-03 DIAGNOSIS — Z96653 Presence of artificial knee joint, bilateral: Secondary | ICD-10-CM | POA: Diagnosis not present

## 2017-04-03 DIAGNOSIS — R635 Abnormal weight gain: Secondary | ICD-10-CM | POA: Diagnosis not present

## 2017-04-03 DIAGNOSIS — K210 Gastro-esophageal reflux disease with esophagitis: Secondary | ICD-10-CM | POA: Diagnosis not present

## 2017-04-03 DIAGNOSIS — Z4789 Encounter for other orthopedic aftercare: Secondary | ICD-10-CM | POA: Diagnosis not present

## 2017-04-04 DIAGNOSIS — Z4789 Encounter for other orthopedic aftercare: Secondary | ICD-10-CM | POA: Diagnosis not present

## 2017-04-04 DIAGNOSIS — Z981 Arthrodesis status: Secondary | ICD-10-CM | POA: Diagnosis not present

## 2017-04-04 DIAGNOSIS — M4317 Spondylolisthesis, lumbosacral region: Secondary | ICD-10-CM | POA: Diagnosis not present

## 2017-04-04 DIAGNOSIS — Z96653 Presence of artificial knee joint, bilateral: Secondary | ICD-10-CM | POA: Diagnosis not present

## 2017-04-07 DIAGNOSIS — M4317 Spondylolisthesis, lumbosacral region: Secondary | ICD-10-CM | POA: Diagnosis not present

## 2017-04-07 DIAGNOSIS — Z96653 Presence of artificial knee joint, bilateral: Secondary | ICD-10-CM | POA: Diagnosis not present

## 2017-04-07 DIAGNOSIS — Z4789 Encounter for other orthopedic aftercare: Secondary | ICD-10-CM | POA: Diagnosis not present

## 2017-04-07 DIAGNOSIS — Z981 Arthrodesis status: Secondary | ICD-10-CM | POA: Diagnosis not present

## 2017-04-10 DIAGNOSIS — Z4789 Encounter for other orthopedic aftercare: Secondary | ICD-10-CM | POA: Diagnosis not present

## 2017-04-10 DIAGNOSIS — Z96653 Presence of artificial knee joint, bilateral: Secondary | ICD-10-CM | POA: Diagnosis not present

## 2017-04-10 DIAGNOSIS — Z981 Arthrodesis status: Secondary | ICD-10-CM | POA: Diagnosis not present

## 2017-04-10 DIAGNOSIS — M4317 Spondylolisthesis, lumbosacral region: Secondary | ICD-10-CM | POA: Diagnosis not present

## 2017-04-11 DIAGNOSIS — Z981 Arthrodesis status: Secondary | ICD-10-CM | POA: Diagnosis not present

## 2017-04-11 DIAGNOSIS — Z4789 Encounter for other orthopedic aftercare: Secondary | ICD-10-CM | POA: Diagnosis not present

## 2017-04-11 DIAGNOSIS — Z96653 Presence of artificial knee joint, bilateral: Secondary | ICD-10-CM | POA: Diagnosis not present

## 2017-04-11 DIAGNOSIS — M4317 Spondylolisthesis, lumbosacral region: Secondary | ICD-10-CM | POA: Diagnosis not present

## 2017-04-12 DIAGNOSIS — Z96653 Presence of artificial knee joint, bilateral: Secondary | ICD-10-CM | POA: Diagnosis not present

## 2017-04-12 DIAGNOSIS — M4320 Fusion of spine, site unspecified: Secondary | ICD-10-CM | POA: Diagnosis not present

## 2017-04-12 DIAGNOSIS — E1122 Type 2 diabetes mellitus with diabetic chronic kidney disease: Secondary | ICD-10-CM | POA: Diagnosis not present

## 2017-04-12 DIAGNOSIS — I129 Hypertensive chronic kidney disease with stage 1 through stage 4 chronic kidney disease, or unspecified chronic kidney disease: Secondary | ICD-10-CM | POA: Diagnosis not present

## 2017-04-12 DIAGNOSIS — R635 Abnormal weight gain: Secondary | ICD-10-CM | POA: Diagnosis not present

## 2017-04-12 DIAGNOSIS — R700 Elevated erythrocyte sedimentation rate: Secondary | ICD-10-CM | POA: Diagnosis not present

## 2017-04-12 DIAGNOSIS — Z981 Arthrodesis status: Secondary | ICD-10-CM | POA: Diagnosis not present

## 2017-04-12 DIAGNOSIS — Z4789 Encounter for other orthopedic aftercare: Secondary | ICD-10-CM | POA: Diagnosis not present

## 2017-04-12 DIAGNOSIS — K210 Gastro-esophageal reflux disease with esophagitis: Secondary | ICD-10-CM | POA: Diagnosis not present

## 2017-04-12 DIAGNOSIS — M4317 Spondylolisthesis, lumbosacral region: Secondary | ICD-10-CM | POA: Diagnosis not present

## 2017-04-12 DIAGNOSIS — R0602 Shortness of breath: Secondary | ICD-10-CM | POA: Diagnosis not present

## 2017-04-12 DIAGNOSIS — D500 Iron deficiency anemia secondary to blood loss (chronic): Secondary | ICD-10-CM | POA: Diagnosis not present

## 2017-04-12 DIAGNOSIS — R609 Edema, unspecified: Secondary | ICD-10-CM | POA: Diagnosis not present

## 2017-04-12 DIAGNOSIS — M2550 Pain in unspecified joint: Secondary | ICD-10-CM | POA: Diagnosis not present

## 2017-04-12 DIAGNOSIS — N183 Chronic kidney disease, stage 3 (moderate): Secondary | ICD-10-CM | POA: Diagnosis not present

## 2017-04-12 DIAGNOSIS — E039 Hypothyroidism, unspecified: Secondary | ICD-10-CM | POA: Diagnosis not present

## 2017-04-14 DIAGNOSIS — M4317 Spondylolisthesis, lumbosacral region: Secondary | ICD-10-CM | POA: Diagnosis not present

## 2017-04-14 DIAGNOSIS — Z96653 Presence of artificial knee joint, bilateral: Secondary | ICD-10-CM | POA: Diagnosis not present

## 2017-04-14 DIAGNOSIS — Z4789 Encounter for other orthopedic aftercare: Secondary | ICD-10-CM | POA: Diagnosis not present

## 2017-04-14 DIAGNOSIS — Z981 Arthrodesis status: Secondary | ICD-10-CM | POA: Diagnosis not present

## 2017-04-17 DIAGNOSIS — M4317 Spondylolisthesis, lumbosacral region: Secondary | ICD-10-CM | POA: Diagnosis not present

## 2017-04-17 DIAGNOSIS — Z981 Arthrodesis status: Secondary | ICD-10-CM | POA: Diagnosis not present

## 2017-04-17 DIAGNOSIS — Z4789 Encounter for other orthopedic aftercare: Secondary | ICD-10-CM | POA: Diagnosis not present

## 2017-04-17 DIAGNOSIS — Z96653 Presence of artificial knee joint, bilateral: Secondary | ICD-10-CM | POA: Diagnosis not present

## 2017-04-18 DIAGNOSIS — Z981 Arthrodesis status: Secondary | ICD-10-CM | POA: Diagnosis not present

## 2017-04-18 DIAGNOSIS — M4317 Spondylolisthesis, lumbosacral region: Secondary | ICD-10-CM | POA: Diagnosis not present

## 2017-04-18 DIAGNOSIS — Z4789 Encounter for other orthopedic aftercare: Secondary | ICD-10-CM | POA: Diagnosis not present

## 2017-04-18 DIAGNOSIS — Z96653 Presence of artificial knee joint, bilateral: Secondary | ICD-10-CM | POA: Diagnosis not present

## 2017-04-19 DIAGNOSIS — R700 Elevated erythrocyte sedimentation rate: Secondary | ICD-10-CM | POA: Diagnosis not present

## 2017-04-19 DIAGNOSIS — I129 Hypertensive chronic kidney disease with stage 1 through stage 4 chronic kidney disease, or unspecified chronic kidney disease: Secondary | ICD-10-CM | POA: Diagnosis not present

## 2017-04-19 DIAGNOSIS — R0602 Shortness of breath: Secondary | ICD-10-CM | POA: Diagnosis not present

## 2017-04-19 DIAGNOSIS — M4320 Fusion of spine, site unspecified: Secondary | ICD-10-CM | POA: Diagnosis not present

## 2017-04-19 DIAGNOSIS — E039 Hypothyroidism, unspecified: Secondary | ICD-10-CM | POA: Diagnosis not present

## 2017-04-19 DIAGNOSIS — K210 Gastro-esophageal reflux disease with esophagitis: Secondary | ICD-10-CM | POA: Diagnosis not present

## 2017-04-19 DIAGNOSIS — R635 Abnormal weight gain: Secondary | ICD-10-CM | POA: Diagnosis not present

## 2017-04-19 DIAGNOSIS — M2550 Pain in unspecified joint: Secondary | ICD-10-CM | POA: Diagnosis not present

## 2017-04-19 DIAGNOSIS — D500 Iron deficiency anemia secondary to blood loss (chronic): Secondary | ICD-10-CM | POA: Diagnosis not present

## 2017-04-19 DIAGNOSIS — N183 Chronic kidney disease, stage 3 (moderate): Secondary | ICD-10-CM | POA: Diagnosis not present

## 2017-04-19 DIAGNOSIS — R609 Edema, unspecified: Secondary | ICD-10-CM | POA: Diagnosis not present

## 2017-04-19 DIAGNOSIS — E1122 Type 2 diabetes mellitus with diabetic chronic kidney disease: Secondary | ICD-10-CM | POA: Diagnosis not present

## 2017-04-26 DIAGNOSIS — M4317 Spondylolisthesis, lumbosacral region: Secondary | ICD-10-CM | POA: Diagnosis not present

## 2017-04-26 DIAGNOSIS — Z4789 Encounter for other orthopedic aftercare: Secondary | ICD-10-CM | POA: Diagnosis not present

## 2017-04-26 DIAGNOSIS — Z96653 Presence of artificial knee joint, bilateral: Secondary | ICD-10-CM | POA: Diagnosis not present

## 2017-04-26 DIAGNOSIS — Z981 Arthrodesis status: Secondary | ICD-10-CM | POA: Diagnosis not present

## 2017-04-28 DIAGNOSIS — Z4789 Encounter for other orthopedic aftercare: Secondary | ICD-10-CM | POA: Diagnosis not present

## 2017-04-28 DIAGNOSIS — Z981 Arthrodesis status: Secondary | ICD-10-CM | POA: Diagnosis not present

## 2017-04-28 DIAGNOSIS — M4317 Spondylolisthesis, lumbosacral region: Secondary | ICD-10-CM | POA: Diagnosis not present

## 2017-04-28 DIAGNOSIS — Z96653 Presence of artificial knee joint, bilateral: Secondary | ICD-10-CM | POA: Diagnosis not present

## 2017-05-01 DIAGNOSIS — Z981 Arthrodesis status: Secondary | ICD-10-CM | POA: Diagnosis not present

## 2017-05-01 DIAGNOSIS — Z4789 Encounter for other orthopedic aftercare: Secondary | ICD-10-CM | POA: Diagnosis not present

## 2017-05-01 DIAGNOSIS — Z96653 Presence of artificial knee joint, bilateral: Secondary | ICD-10-CM | POA: Diagnosis not present

## 2017-05-01 DIAGNOSIS — M4317 Spondylolisthesis, lumbosacral region: Secondary | ICD-10-CM | POA: Diagnosis not present

## 2017-05-03 DIAGNOSIS — M4317 Spondylolisthesis, lumbosacral region: Secondary | ICD-10-CM | POA: Diagnosis not present

## 2017-05-03 DIAGNOSIS — Z4789 Encounter for other orthopedic aftercare: Secondary | ICD-10-CM | POA: Diagnosis not present

## 2017-05-03 DIAGNOSIS — Z981 Arthrodesis status: Secondary | ICD-10-CM | POA: Diagnosis not present

## 2017-05-03 DIAGNOSIS — Z96653 Presence of artificial knee joint, bilateral: Secondary | ICD-10-CM | POA: Diagnosis not present

## 2017-05-10 DIAGNOSIS — Z981 Arthrodesis status: Secondary | ICD-10-CM | POA: Diagnosis not present

## 2017-05-10 DIAGNOSIS — Z96653 Presence of artificial knee joint, bilateral: Secondary | ICD-10-CM | POA: Diagnosis not present

## 2017-05-10 DIAGNOSIS — Z4789 Encounter for other orthopedic aftercare: Secondary | ICD-10-CM | POA: Diagnosis not present

## 2017-05-10 DIAGNOSIS — M4317 Spondylolisthesis, lumbosacral region: Secondary | ICD-10-CM | POA: Diagnosis not present

## 2017-05-12 DIAGNOSIS — Z4789 Encounter for other orthopedic aftercare: Secondary | ICD-10-CM | POA: Diagnosis not present

## 2017-05-12 DIAGNOSIS — Z96653 Presence of artificial knee joint, bilateral: Secondary | ICD-10-CM | POA: Diagnosis not present

## 2017-05-12 DIAGNOSIS — M4317 Spondylolisthesis, lumbosacral region: Secondary | ICD-10-CM | POA: Diagnosis not present

## 2017-05-12 DIAGNOSIS — Z981 Arthrodesis status: Secondary | ICD-10-CM | POA: Diagnosis not present

## 2017-05-17 DIAGNOSIS — M4317 Spondylolisthesis, lumbosacral region: Secondary | ICD-10-CM | POA: Diagnosis not present

## 2017-05-17 DIAGNOSIS — Z4789 Encounter for other orthopedic aftercare: Secondary | ICD-10-CM | POA: Diagnosis not present

## 2017-05-17 DIAGNOSIS — Z981 Arthrodesis status: Secondary | ICD-10-CM | POA: Diagnosis not present

## 2017-05-17 DIAGNOSIS — Z96653 Presence of artificial knee joint, bilateral: Secondary | ICD-10-CM | POA: Diagnosis not present

## 2017-05-19 DIAGNOSIS — Z981 Arthrodesis status: Secondary | ICD-10-CM | POA: Diagnosis not present

## 2017-05-19 DIAGNOSIS — Z4789 Encounter for other orthopedic aftercare: Secondary | ICD-10-CM | POA: Diagnosis not present

## 2017-05-19 DIAGNOSIS — M4317 Spondylolisthesis, lumbosacral region: Secondary | ICD-10-CM | POA: Diagnosis not present

## 2017-05-19 DIAGNOSIS — Z96653 Presence of artificial knee joint, bilateral: Secondary | ICD-10-CM | POA: Diagnosis not present

## 2017-05-22 DIAGNOSIS — J22 Unspecified acute lower respiratory infection: Secondary | ICD-10-CM | POA: Diagnosis not present

## 2017-05-22 DIAGNOSIS — K1120 Sialoadenitis, unspecified: Secondary | ICD-10-CM | POA: Diagnosis not present

## 2017-05-22 DIAGNOSIS — J039 Acute tonsillitis, unspecified: Secondary | ICD-10-CM | POA: Diagnosis present

## 2017-05-22 DIAGNOSIS — R911 Solitary pulmonary nodule: Secondary | ICD-10-CM | POA: Diagnosis not present

## 2017-05-22 DIAGNOSIS — K209 Esophagitis, unspecified: Secondary | ICD-10-CM | POA: Diagnosis not present

## 2017-05-22 DIAGNOSIS — E039 Hypothyroidism, unspecified: Secondary | ICD-10-CM | POA: Diagnosis not present

## 2017-05-22 DIAGNOSIS — R05 Cough: Secondary | ICD-10-CM | POA: Diagnosis not present

## 2017-05-22 DIAGNOSIS — Z87891 Personal history of nicotine dependence: Secondary | ICD-10-CM | POA: Diagnosis not present

## 2017-05-22 DIAGNOSIS — J36 Peritonsillar abscess: Secondary | ICD-10-CM | POA: Diagnosis not present

## 2017-05-22 DIAGNOSIS — R1312 Dysphagia, oropharyngeal phase: Secondary | ICD-10-CM | POA: Diagnosis not present

## 2017-05-22 DIAGNOSIS — E7800 Pure hypercholesterolemia, unspecified: Secondary | ICD-10-CM | POA: Diagnosis present

## 2017-05-22 DIAGNOSIS — Z8711 Personal history of peptic ulcer disease: Secondary | ICD-10-CM | POA: Diagnosis not present

## 2017-05-22 DIAGNOSIS — R1310 Dysphagia, unspecified: Secondary | ICD-10-CM | POA: Diagnosis not present

## 2017-05-22 DIAGNOSIS — I10 Essential (primary) hypertension: Secondary | ICD-10-CM | POA: Diagnosis present

## 2017-05-30 DIAGNOSIS — J36 Peritonsillar abscess: Secondary | ICD-10-CM | POA: Diagnosis not present

## 2017-05-30 DIAGNOSIS — J342 Deviated nasal septum: Secondary | ICD-10-CM | POA: Diagnosis not present

## 2017-05-30 DIAGNOSIS — J039 Acute tonsillitis, unspecified: Secondary | ICD-10-CM | POA: Diagnosis not present

## 2017-05-30 DIAGNOSIS — J029 Acute pharyngitis, unspecified: Secondary | ICD-10-CM | POA: Diagnosis not present

## 2017-05-30 DIAGNOSIS — J310 Chronic rhinitis: Secondary | ICD-10-CM | POA: Diagnosis not present

## 2017-05-31 DIAGNOSIS — M4727 Other spondylosis with radiculopathy, lumbosacral region: Secondary | ICD-10-CM | POA: Diagnosis not present

## 2017-05-31 DIAGNOSIS — M4317 Spondylolisthesis, lumbosacral region: Secondary | ICD-10-CM | POA: Diagnosis not present

## 2017-06-01 DIAGNOSIS — J36 Peritonsillar abscess: Secondary | ICD-10-CM | POA: Diagnosis not present

## 2017-06-01 DIAGNOSIS — K210 Gastro-esophageal reflux disease with esophagitis: Secondary | ICD-10-CM | POA: Diagnosis not present

## 2017-06-01 DIAGNOSIS — R911 Solitary pulmonary nodule: Secondary | ICD-10-CM | POA: Diagnosis not present

## 2017-06-01 DIAGNOSIS — Z9189 Other specified personal risk factors, not elsewhere classified: Secondary | ICD-10-CM | POA: Diagnosis not present

## 2017-06-06 DIAGNOSIS — R2689 Other abnormalities of gait and mobility: Secondary | ICD-10-CM | POA: Diagnosis not present

## 2017-06-06 DIAGNOSIS — M545 Low back pain: Secondary | ICD-10-CM | POA: Diagnosis not present

## 2017-06-06 DIAGNOSIS — M4317 Spondylolisthesis, lumbosacral region: Secondary | ICD-10-CM | POA: Diagnosis not present

## 2017-06-08 DIAGNOSIS — R2689 Other abnormalities of gait and mobility: Secondary | ICD-10-CM | POA: Diagnosis not present

## 2017-06-08 DIAGNOSIS — M545 Low back pain: Secondary | ICD-10-CM | POA: Diagnosis not present

## 2017-06-08 DIAGNOSIS — M4317 Spondylolisthesis, lumbosacral region: Secondary | ICD-10-CM | POA: Diagnosis not present

## 2017-06-13 DIAGNOSIS — R2689 Other abnormalities of gait and mobility: Secondary | ICD-10-CM | POA: Diagnosis not present

## 2017-06-13 DIAGNOSIS — M4317 Spondylolisthesis, lumbosacral region: Secondary | ICD-10-CM | POA: Diagnosis not present

## 2017-06-13 DIAGNOSIS — M545 Low back pain: Secondary | ICD-10-CM | POA: Diagnosis not present

## 2017-06-15 DIAGNOSIS — R2689 Other abnormalities of gait and mobility: Secondary | ICD-10-CM | POA: Diagnosis not present

## 2017-06-15 DIAGNOSIS — N183 Chronic kidney disease, stage 3 (moderate): Secondary | ICD-10-CM | POA: Diagnosis not present

## 2017-06-15 DIAGNOSIS — Z0000 Encounter for general adult medical examination without abnormal findings: Secondary | ICD-10-CM | POA: Diagnosis not present

## 2017-06-15 DIAGNOSIS — I129 Hypertensive chronic kidney disease with stage 1 through stage 4 chronic kidney disease, or unspecified chronic kidney disease: Secondary | ICD-10-CM | POA: Diagnosis not present

## 2017-06-15 DIAGNOSIS — E785 Hyperlipidemia, unspecified: Secondary | ICD-10-CM | POA: Diagnosis not present

## 2017-06-15 DIAGNOSIS — E663 Overweight: Secondary | ICD-10-CM | POA: Diagnosis not present

## 2017-06-15 DIAGNOSIS — M4317 Spondylolisthesis, lumbosacral region: Secondary | ICD-10-CM | POA: Diagnosis not present

## 2017-06-15 DIAGNOSIS — M545 Low back pain: Secondary | ICD-10-CM | POA: Diagnosis not present

## 2017-06-15 DIAGNOSIS — Z1389 Encounter for screening for other disorder: Secondary | ICD-10-CM | POA: Diagnosis not present

## 2017-06-20 DIAGNOSIS — M4317 Spondylolisthesis, lumbosacral region: Secondary | ICD-10-CM | POA: Diagnosis not present

## 2017-06-20 DIAGNOSIS — M545 Low back pain: Secondary | ICD-10-CM | POA: Diagnosis not present

## 2017-06-20 DIAGNOSIS — R2689 Other abnormalities of gait and mobility: Secondary | ICD-10-CM | POA: Diagnosis not present

## 2017-06-21 DIAGNOSIS — Z6828 Body mass index (BMI) 28.0-28.9, adult: Secondary | ICD-10-CM | POA: Diagnosis not present

## 2017-06-21 DIAGNOSIS — R030 Elevated blood-pressure reading, without diagnosis of hypertension: Secondary | ICD-10-CM | POA: Diagnosis not present

## 2017-06-21 DIAGNOSIS — M545 Low back pain: Secondary | ICD-10-CM | POA: Diagnosis not present

## 2017-06-22 DIAGNOSIS — R2689 Other abnormalities of gait and mobility: Secondary | ICD-10-CM | POA: Diagnosis not present

## 2017-06-22 DIAGNOSIS — M4317 Spondylolisthesis, lumbosacral region: Secondary | ICD-10-CM | POA: Diagnosis not present

## 2017-06-22 DIAGNOSIS — M545 Low back pain: Secondary | ICD-10-CM | POA: Diagnosis not present

## 2017-06-23 DIAGNOSIS — Z1231 Encounter for screening mammogram for malignant neoplasm of breast: Secondary | ICD-10-CM | POA: Diagnosis not present

## 2017-06-28 DIAGNOSIS — M4317 Spondylolisthesis, lumbosacral region: Secondary | ICD-10-CM | POA: Diagnosis not present

## 2017-06-28 DIAGNOSIS — R2689 Other abnormalities of gait and mobility: Secondary | ICD-10-CM | POA: Diagnosis not present

## 2017-06-28 DIAGNOSIS — M545 Low back pain: Secondary | ICD-10-CM | POA: Diagnosis not present

## 2017-06-30 DIAGNOSIS — M545 Low back pain: Secondary | ICD-10-CM | POA: Diagnosis not present

## 2017-06-30 DIAGNOSIS — R2689 Other abnormalities of gait and mobility: Secondary | ICD-10-CM | POA: Diagnosis not present

## 2017-06-30 DIAGNOSIS — M4317 Spondylolisthesis, lumbosacral region: Secondary | ICD-10-CM | POA: Diagnosis not present

## 2017-07-03 DIAGNOSIS — Z79899 Other long term (current) drug therapy: Secondary | ICD-10-CM | POA: Diagnosis not present

## 2017-07-03 DIAGNOSIS — M109 Gout, unspecified: Secondary | ICD-10-CM | POA: Diagnosis not present

## 2017-07-03 DIAGNOSIS — I129 Hypertensive chronic kidney disease with stage 1 through stage 4 chronic kidney disease, or unspecified chronic kidney disease: Secondary | ICD-10-CM | POA: Diagnosis not present

## 2017-07-03 DIAGNOSIS — E1122 Type 2 diabetes mellitus with diabetic chronic kidney disease: Secondary | ICD-10-CM | POA: Diagnosis not present

## 2017-07-03 DIAGNOSIS — E785 Hyperlipidemia, unspecified: Secondary | ICD-10-CM | POA: Diagnosis not present

## 2017-07-03 DIAGNOSIS — R309 Painful micturition, unspecified: Secondary | ICD-10-CM | POA: Diagnosis not present

## 2017-07-03 DIAGNOSIS — E039 Hypothyroidism, unspecified: Secondary | ICD-10-CM | POA: Diagnosis not present

## 2017-07-03 DIAGNOSIS — E559 Vitamin D deficiency, unspecified: Secondary | ICD-10-CM | POA: Diagnosis not present

## 2017-07-03 DIAGNOSIS — N183 Chronic kidney disease, stage 3 (moderate): Secondary | ICD-10-CM | POA: Diagnosis not present

## 2017-07-03 DIAGNOSIS — N390 Urinary tract infection, site not specified: Secondary | ICD-10-CM | POA: Diagnosis not present

## 2017-07-03 DIAGNOSIS — R911 Solitary pulmonary nodule: Secondary | ICD-10-CM | POA: Diagnosis not present

## 2017-07-04 DIAGNOSIS — R2689 Other abnormalities of gait and mobility: Secondary | ICD-10-CM | POA: Diagnosis not present

## 2017-07-04 DIAGNOSIS — M4317 Spondylolisthesis, lumbosacral region: Secondary | ICD-10-CM | POA: Diagnosis not present

## 2017-07-04 DIAGNOSIS — M545 Low back pain: Secondary | ICD-10-CM | POA: Diagnosis not present

## 2017-07-06 DIAGNOSIS — M545 Low back pain: Secondary | ICD-10-CM | POA: Diagnosis not present

## 2017-07-06 DIAGNOSIS — R2689 Other abnormalities of gait and mobility: Secondary | ICD-10-CM | POA: Diagnosis not present

## 2017-07-06 DIAGNOSIS — M4317 Spondylolisthesis, lumbosacral region: Secondary | ICD-10-CM | POA: Diagnosis not present

## 2017-07-11 DIAGNOSIS — M545 Low back pain: Secondary | ICD-10-CM | POA: Diagnosis not present

## 2017-07-11 DIAGNOSIS — R2689 Other abnormalities of gait and mobility: Secondary | ICD-10-CM | POA: Diagnosis not present

## 2017-07-11 DIAGNOSIS — M4317 Spondylolisthesis, lumbosacral region: Secondary | ICD-10-CM | POA: Diagnosis not present

## 2017-07-11 DIAGNOSIS — N390 Urinary tract infection, site not specified: Secondary | ICD-10-CM | POA: Diagnosis not present

## 2017-07-13 DIAGNOSIS — R2689 Other abnormalities of gait and mobility: Secondary | ICD-10-CM | POA: Diagnosis not present

## 2017-07-13 DIAGNOSIS — M4317 Spondylolisthesis, lumbosacral region: Secondary | ICD-10-CM | POA: Diagnosis not present

## 2017-07-13 DIAGNOSIS — M545 Low back pain: Secondary | ICD-10-CM | POA: Diagnosis not present

## 2017-07-17 DIAGNOSIS — M9080 Osteopathy in diseases classified elsewhere, unspecified site: Secondary | ICD-10-CM | POA: Diagnosis not present

## 2017-07-17 DIAGNOSIS — I129 Hypertensive chronic kidney disease with stage 1 through stage 4 chronic kidney disease, or unspecified chronic kidney disease: Secondary | ICD-10-CM | POA: Diagnosis not present

## 2017-07-17 DIAGNOSIS — E889 Metabolic disorder, unspecified: Secondary | ICD-10-CM | POA: Diagnosis not present

## 2017-07-17 DIAGNOSIS — N183 Chronic kidney disease, stage 3 (moderate): Secondary | ICD-10-CM | POA: Diagnosis not present

## 2017-07-17 DIAGNOSIS — E559 Vitamin D deficiency, unspecified: Secondary | ICD-10-CM | POA: Diagnosis not present

## 2017-07-18 DIAGNOSIS — Z471 Aftercare following joint replacement surgery: Secondary | ICD-10-CM | POA: Diagnosis not present

## 2017-07-18 DIAGNOSIS — M545 Low back pain: Secondary | ICD-10-CM | POA: Diagnosis not present

## 2017-07-18 DIAGNOSIS — Z96653 Presence of artificial knee joint, bilateral: Secondary | ICD-10-CM | POA: Diagnosis not present

## 2017-07-18 DIAGNOSIS — M4317 Spondylolisthesis, lumbosacral region: Secondary | ICD-10-CM | POA: Diagnosis not present

## 2017-07-18 DIAGNOSIS — Z96651 Presence of right artificial knee joint: Secondary | ICD-10-CM | POA: Diagnosis not present

## 2017-07-18 DIAGNOSIS — Z96652 Presence of left artificial knee joint: Secondary | ICD-10-CM | POA: Diagnosis not present

## 2017-07-18 DIAGNOSIS — R2689 Other abnormalities of gait and mobility: Secondary | ICD-10-CM | POA: Diagnosis not present

## 2017-07-19 ENCOUNTER — Encounter: Payer: Self-pay | Admitting: Gastroenterology

## 2017-07-21 DIAGNOSIS — M545 Low back pain: Secondary | ICD-10-CM | POA: Diagnosis not present

## 2017-07-21 DIAGNOSIS — R2689 Other abnormalities of gait and mobility: Secondary | ICD-10-CM | POA: Diagnosis not present

## 2017-07-21 DIAGNOSIS — M4317 Spondylolisthesis, lumbosacral region: Secondary | ICD-10-CM | POA: Diagnosis not present

## 2017-07-24 DIAGNOSIS — N390 Urinary tract infection, site not specified: Secondary | ICD-10-CM | POA: Diagnosis not present

## 2017-07-25 DIAGNOSIS — M545 Low back pain: Secondary | ICD-10-CM | POA: Diagnosis not present

## 2017-07-25 DIAGNOSIS — M4317 Spondylolisthesis, lumbosacral region: Secondary | ICD-10-CM | POA: Diagnosis not present

## 2017-07-25 DIAGNOSIS — R2689 Other abnormalities of gait and mobility: Secondary | ICD-10-CM | POA: Diagnosis not present

## 2017-07-27 DIAGNOSIS — M545 Low back pain: Secondary | ICD-10-CM | POA: Diagnosis not present

## 2017-07-27 DIAGNOSIS — M4317 Spondylolisthesis, lumbosacral region: Secondary | ICD-10-CM | POA: Diagnosis not present

## 2017-07-27 DIAGNOSIS — R2689 Other abnormalities of gait and mobility: Secondary | ICD-10-CM | POA: Diagnosis not present

## 2017-07-31 DIAGNOSIS — M545 Low back pain: Secondary | ICD-10-CM | POA: Diagnosis not present

## 2017-07-31 DIAGNOSIS — M4317 Spondylolisthesis, lumbosacral region: Secondary | ICD-10-CM | POA: Diagnosis not present

## 2017-07-31 DIAGNOSIS — R2689 Other abnormalities of gait and mobility: Secondary | ICD-10-CM | POA: Diagnosis not present

## 2017-08-04 DIAGNOSIS — E1122 Type 2 diabetes mellitus with diabetic chronic kidney disease: Secondary | ICD-10-CM | POA: Diagnosis not present

## 2017-08-04 DIAGNOSIS — N183 Chronic kidney disease, stage 3 (moderate): Secondary | ICD-10-CM | POA: Diagnosis not present

## 2017-08-08 ENCOUNTER — Ambulatory Visit (INDEPENDENT_AMBULATORY_CARE_PROVIDER_SITE_OTHER): Payer: Medicare Other | Admitting: Gastroenterology

## 2017-08-08 ENCOUNTER — Encounter: Payer: Self-pay | Admitting: Gastroenterology

## 2017-08-08 VITALS — BP 130/72 | HR 83 | Ht 64.0 in | Wt 171.0 lb

## 2017-08-08 DIAGNOSIS — K219 Gastro-esophageal reflux disease without esophagitis: Secondary | ICD-10-CM | POA: Diagnosis not present

## 2017-08-08 DIAGNOSIS — R2689 Other abnormalities of gait and mobility: Secondary | ICD-10-CM | POA: Diagnosis not present

## 2017-08-08 DIAGNOSIS — M4317 Spondylolisthesis, lumbosacral region: Secondary | ICD-10-CM | POA: Diagnosis not present

## 2017-08-08 DIAGNOSIS — M545 Low back pain: Secondary | ICD-10-CM | POA: Diagnosis not present

## 2017-08-08 MED ORDER — PANTOPRAZOLE SODIUM 40 MG PO TBEC: 40.0000 mg | DELAYED_RELEASE_TABLET | Freq: Every day | ORAL | 2 refills | Status: AC

## 2017-08-08 NOTE — Progress Notes (Signed)
IMPRESSION and PLAN:    #1. Gastroesophageal reflux disease, esophagitis presence not specified - with small HH Had EGD 10/25/2007 which showed hiatal hernia, Schatzki's ring status post esophageal dilatation.  Continue Protonix 40 mg p.o. once a day on as needed basis for now.  Minimize PPI use. Nonpharmacologic means of reflux controlled. Patient is to follow-up or call us if she starts having any dysphagia, any new symptoms or any weight loss. She would like to hold off on EGD. I do agree.  #2.  Peritonsillar abscess (resolved).  Followed by Dr. Gaylyn Cheers      HPI:    Chief Complaint:    Patient is a very pleasant 83 year old white female who had left peritonsillar abscess May 25, 2017,   Treated successfully with clindamycin.  Being followed by Dr. Gaylyn Cheers (ENT). She was also started on Protonix.  She had dysphagia which is completely resolved after resolution of peritonsillar abscess.  At the present time, she denies having any significant GI complaints.  She denies having any nausea, vomiting, heartburn, regurgitation, odynophagia or dysphagia.  She has been sent to the GI clinic to determine whether she needs to continue Protonix.     Review of systems:       Past Medical History:  Diagnosis Date  . Arthritis   . Chronic kidney disease    3 th stage kidney disease  . Complication of anesthesia   . Diabetes mellitus without complication (HCC)    no meds  . Dyspnea   . Edema   . GERD (gastroesophageal reflux disease)   . Gout   . Hx of blood clots    lt leg  . Hypothyroidism   . IBS (irritable bowel syndrome)   . Pneumonia   . PONV (postoperative nausea and vomiting)     Current Outpatient Medications  Medication Sig Dispense Refill  . diphenhydrAMINE (BENADRYL) 25 mg capsule Take 25 mg by mouth every 8 (eight) hours as needed.     . febuxostat (ULORIC) 40 MG tablet Take 40 mg by mouth every other day.     . furosemide (LASIX) 20 MG tablet Take 20 mg by  mouth 2 (two) times daily.     Marland Kitchen gabapentin (NEURONTIN) 300 MG capsule Take 1 capsule (300 mg total) 3 (three) times daily by mouth. (Patient taking differently: Take 300 mg by mouth 2 (two) times daily. ) 90 capsule 2  . levothyroxine (SYNTHROID, LEVOTHROID) 50 MCG tablet Take 50 mcg by mouth daily before breakfast.    . polyethylene glycol (MIRALAX / GLYCOLAX) packet Take 17 g by mouth 2 (two) times daily.    . potassium chloride (K-DUR) 10 MEQ tablet Take 10 mEq by mouth every other day.     . Vitamin D, Ergocalciferol, (DRISDOL) 50000 UNITS CAPS capsule Take 50,000 Units by mouth every 14 (fourteen) days.     . pantoprazole (PROTONIX) 40 MG tablet Take 1 tablet (40 mg total) by mouth daily. 30 tablet 2   No current facility-administered medications for this visit.     Patient's ROS, surgical history, family medical history, social history and allergies were all reviewed in Epic    Physical Exam:     BP 130/72   Pulse 83   Ht 5\' 4"  (1.626 m)   Wt 171 lb (77.6 kg)   BMI 29.35 kg/m   GENERAL:  NAD PSYCH: :Pleasant, cooperative, normal affect EENT:  conjunctiva pink, mucous membranes moist, neck supple without masses CARDIAC:  RRR, No murmur heard, + peripheral edema PULM: Normal respiratory effort, lungs CTA bilaterally, no wheezing ABDOMEN:  Nondistended, soft, nontender. No obvious masses, no hepatomegaly,  normal bowel sounds SKIN:  turgor, no lesions seen Musculoskeletal:  Normal muscle tone, normal strength NEURO: Alert and oriented x 3, no focal neurologic deficits   08/08/2017, 7:49 PM

## 2017-08-08 NOTE — Patient Instructions (Signed)
If you are age 81 or older, your body mass index should be between 23-30. Your Body mass index is 29.35 kg/m. If this is out of the aforementioned range listed, please consider follow up with your Primary Care Provider.  If you are age 68 or younger, your body mass index should be between 19-25. Your Body mass index is 29.35 kg/m. If this is out of the aformentioned range listed, please consider follow up with your Primary Care Provider.   We have sent the following medications to your pharmacy for you to pick up at your convenience: Protonix 40mg  daily.  Thank you,  Dr. Jackquline Denmark

## 2017-08-10 DIAGNOSIS — M545 Low back pain: Secondary | ICD-10-CM | POA: Diagnosis not present

## 2017-08-10 DIAGNOSIS — M4317 Spondylolisthesis, lumbosacral region: Secondary | ICD-10-CM | POA: Diagnosis not present

## 2017-08-10 DIAGNOSIS — R2689 Other abnormalities of gait and mobility: Secondary | ICD-10-CM | POA: Diagnosis not present

## 2017-08-15 DIAGNOSIS — R2689 Other abnormalities of gait and mobility: Secondary | ICD-10-CM | POA: Diagnosis not present

## 2017-08-15 DIAGNOSIS — M4317 Spondylolisthesis, lumbosacral region: Secondary | ICD-10-CM | POA: Diagnosis not present

## 2017-08-15 DIAGNOSIS — M545 Low back pain: Secondary | ICD-10-CM | POA: Diagnosis not present

## 2017-08-17 DIAGNOSIS — R2689 Other abnormalities of gait and mobility: Secondary | ICD-10-CM | POA: Diagnosis not present

## 2017-08-17 DIAGNOSIS — M4317 Spondylolisthesis, lumbosacral region: Secondary | ICD-10-CM | POA: Diagnosis not present

## 2017-08-17 DIAGNOSIS — M545 Low back pain: Secondary | ICD-10-CM | POA: Diagnosis not present

## 2017-10-04 DIAGNOSIS — E1122 Type 2 diabetes mellitus with diabetic chronic kidney disease: Secondary | ICD-10-CM | POA: Diagnosis not present

## 2017-10-04 DIAGNOSIS — Z79899 Other long term (current) drug therapy: Secondary | ICD-10-CM | POA: Diagnosis not present

## 2017-10-04 DIAGNOSIS — N183 Chronic kidney disease, stage 3 (moderate): Secondary | ICD-10-CM | POA: Diagnosis not present

## 2017-10-04 DIAGNOSIS — E039 Hypothyroidism, unspecified: Secondary | ICD-10-CM | POA: Diagnosis not present

## 2017-10-13 DIAGNOSIS — Z6829 Body mass index (BMI) 29.0-29.9, adult: Secondary | ICD-10-CM | POA: Diagnosis not present

## 2017-10-13 DIAGNOSIS — R030 Elevated blood-pressure reading, without diagnosis of hypertension: Secondary | ICD-10-CM | POA: Diagnosis not present

## 2017-10-13 DIAGNOSIS — M4317 Spondylolisthesis, lumbosacral region: Secondary | ICD-10-CM | POA: Diagnosis not present

## 2017-10-13 DIAGNOSIS — M545 Low back pain: Secondary | ICD-10-CM | POA: Diagnosis not present

## 2017-10-30 DIAGNOSIS — E559 Vitamin D deficiency, unspecified: Secondary | ICD-10-CM | POA: Diagnosis not present

## 2017-10-30 DIAGNOSIS — N183 Chronic kidney disease, stage 3 (moderate): Secondary | ICD-10-CM | POA: Diagnosis not present

## 2017-10-30 DIAGNOSIS — M9080 Osteopathy in diseases classified elsewhere, unspecified site: Secondary | ICD-10-CM | POA: Diagnosis not present

## 2017-10-30 DIAGNOSIS — E889 Metabolic disorder, unspecified: Secondary | ICD-10-CM | POA: Diagnosis not present

## 2017-10-30 DIAGNOSIS — I129 Hypertensive chronic kidney disease with stage 1 through stage 4 chronic kidney disease, or unspecified chronic kidney disease: Secondary | ICD-10-CM | POA: Diagnosis not present

## 2017-11-07 DIAGNOSIS — E889 Metabolic disorder, unspecified: Secondary | ICD-10-CM | POA: Diagnosis not present

## 2017-11-07 DIAGNOSIS — M9080 Osteopathy in diseases classified elsewhere, unspecified site: Secondary | ICD-10-CM | POA: Diagnosis not present

## 2017-11-07 DIAGNOSIS — E1122 Type 2 diabetes mellitus with diabetic chronic kidney disease: Secondary | ICD-10-CM | POA: Diagnosis not present

## 2017-11-07 DIAGNOSIS — N183 Chronic kidney disease, stage 3 (moderate): Secondary | ICD-10-CM | POA: Diagnosis not present

## 2017-11-07 DIAGNOSIS — I129 Hypertensive chronic kidney disease with stage 1 through stage 4 chronic kidney disease, or unspecified chronic kidney disease: Secondary | ICD-10-CM | POA: Diagnosis not present

## 2017-11-07 DIAGNOSIS — E559 Vitamin D deficiency, unspecified: Secondary | ICD-10-CM | POA: Diagnosis not present

## 2017-11-17 IMAGING — CT CT L SPINE W/O CM
3 of 5 series · 14 of 33 positions shown, 16 images · non-contrast
Comparison: Lumbar spine MRI 08/30/2016

CLINICAL DATA: Preop planning for lumbar spine surgery

EXAM:
CT LUMBAR SPINE WITHOUT CONTRAST
TECHNIQUE: Multidetector CT imaging of the lumbar spine was performed without
intravenous contrast administration. Multiplanar CT image
reconstructions were also generated.

[Series 5: l spine soft · axial · 0.39mm/px · z∈[+499,+677]mm · 8 of 107 slices shown, 10 images]
[im 9/107  soft-tissue]
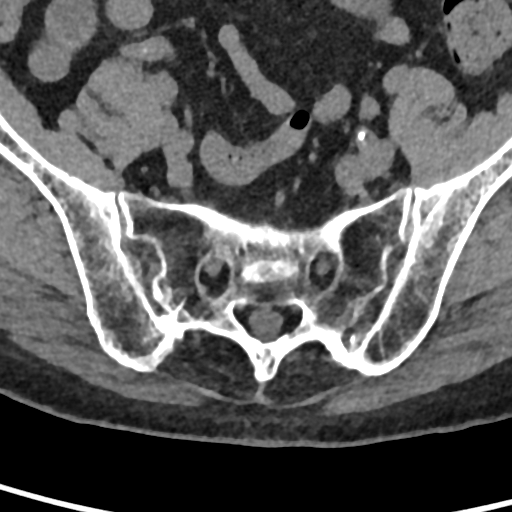
[im 9/107  bone]
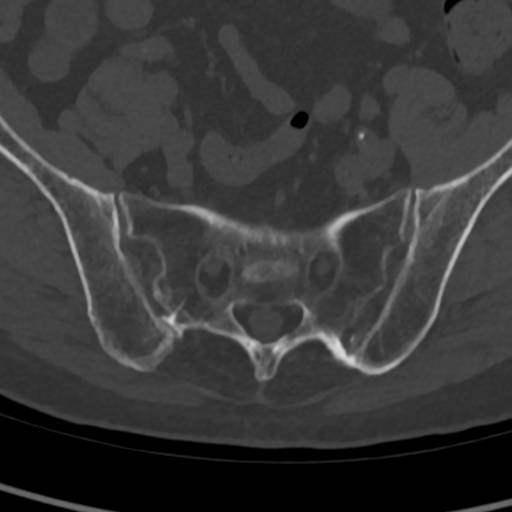
[im 25/107  bone]
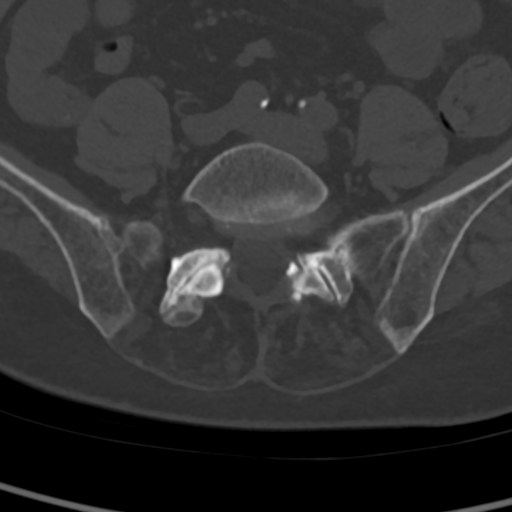
[im 33/107  bone]
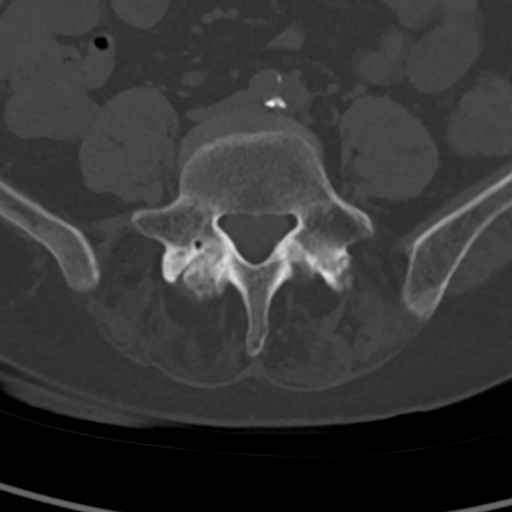
[im 49/107  bone]
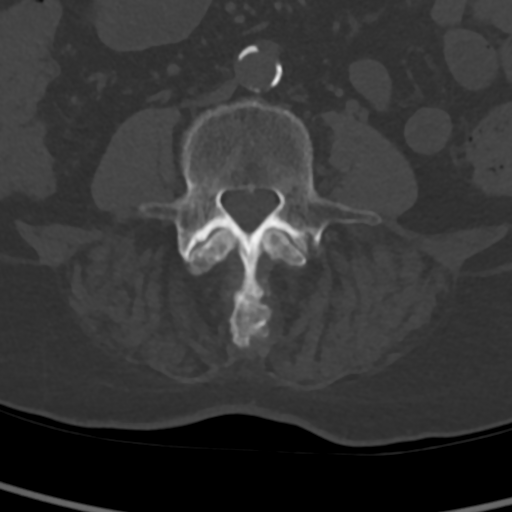
[im 58/107  soft-tissue]
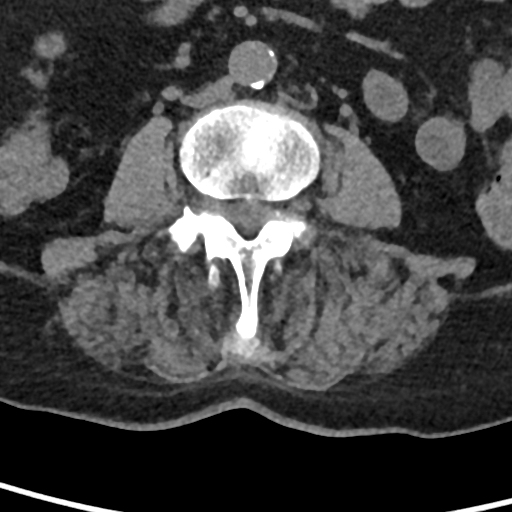
[im 58/107  bone]
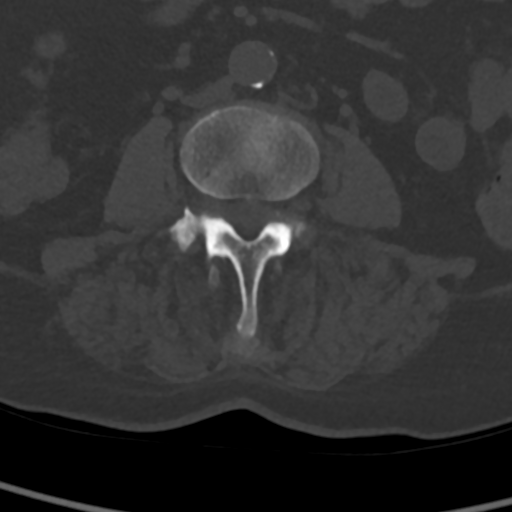
[im 74/107  bone]
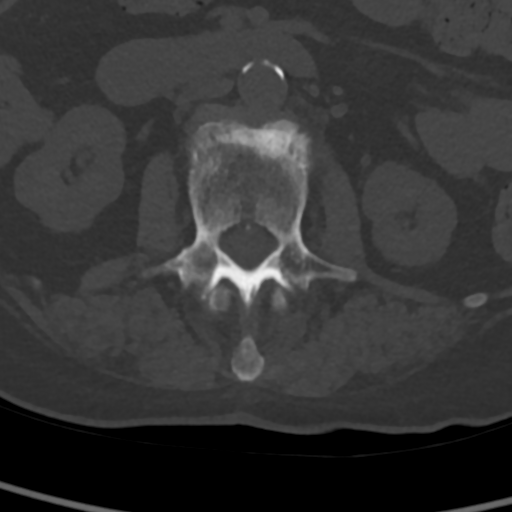
[im 82/107  bone]
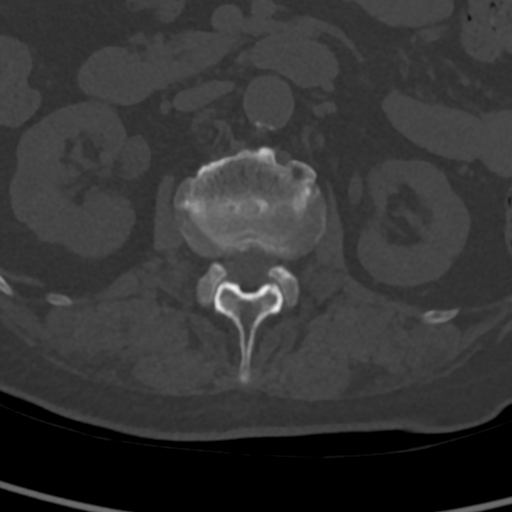
[im 98/107  bone]
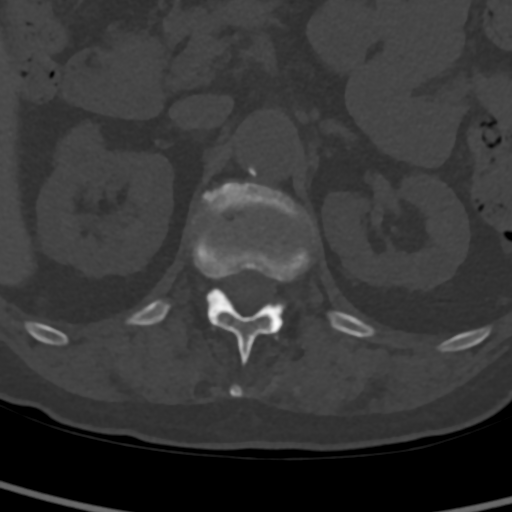

[Series 7: cor bone · coronal · 0.38mm/px · 1 of 65 slices shown]
[im 33/65  bone]
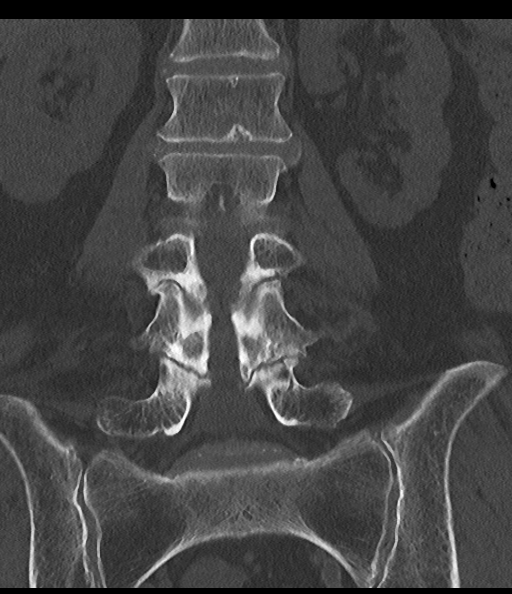

[Series 9: sag st · sagittal · 0.35mm/px · 5 of 85 slices shown]
[im 15/85  bone]
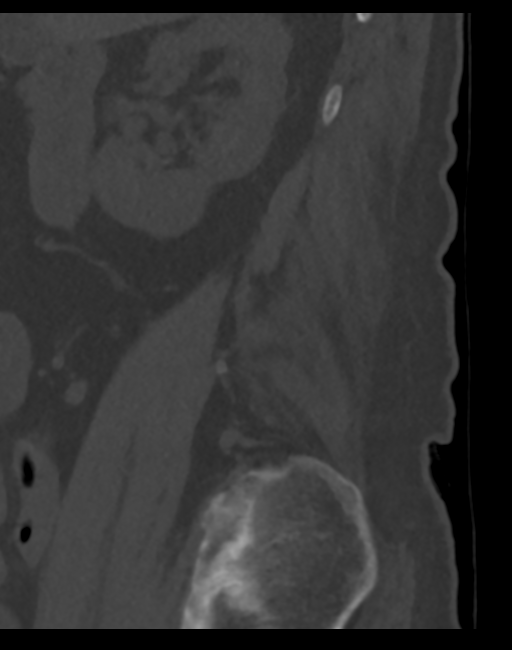
[im 29/85  bone]
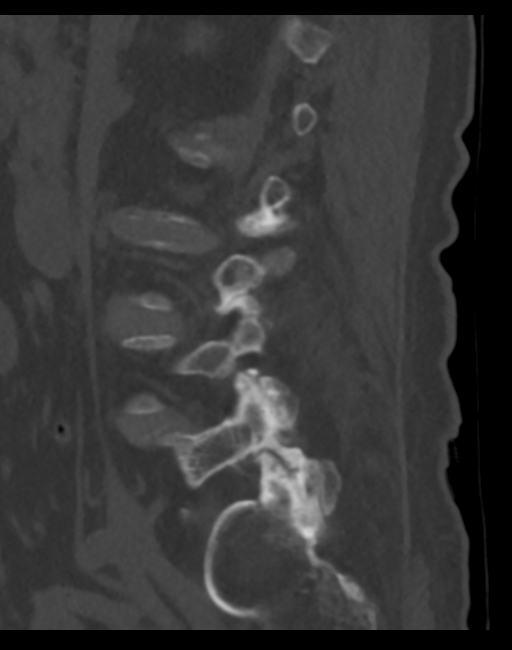
[im 43/85  bone]
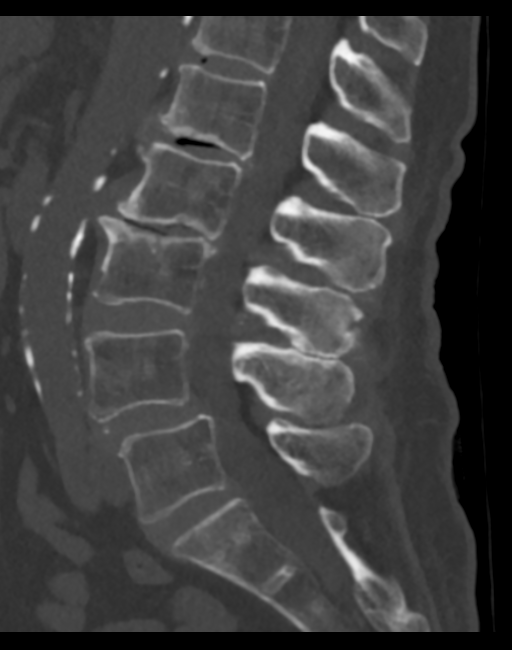
[im 57/85  bone]
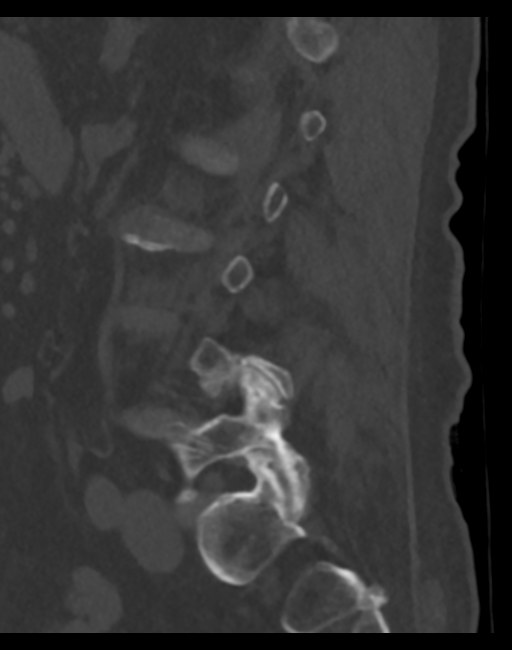
[im 71/85  bone]
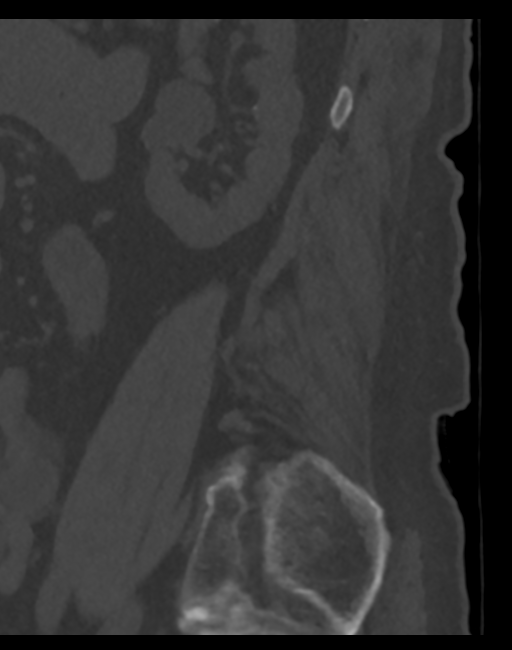

[14 of 33 positions shown; findings below may reference images not displayed]

FINDINGS: Segmentation: 5 lumbar type vertebrae.

Alignment: Grade 1 L4-L5 anterolisthesis.

Vertebrae: No acute fracture or focal pathologic process.

Paraspinal and other soft tissues: There is atherosclerotic
calcification of the non aneurysmal abdominal aorta.

Disc levels: Degenerative disc disease is better evaluated on the
MRI 08/30/2016.

L1-L2: Narrowing of the disc space with disc vacuum phenomenon and
small disc bulge without spinal canal stenosis.

L2-L3: Severe spinal canal stenosis with combination of disc bulge
and severe facet hypertrophy. There is severe neural foraminal
narrowing.

L3-L4: Facet hypertrophy and disc bulge with at least moderate
spinal canal stenosis.

L4-L5: Grade 1 anterolisthesis from severe bilateral facet
hypertrophy. Mild-to-moderate spinal canal stenosis.

L5-S1:  No spinal canal stenosis or neural foraminal stenosis.
IMPRESSION: 1. Preoperative lumbar spine CT using the SAKARAN protocol.
2. Multilevel lumbar degenerative disc disease, better characterized
on the prior MRI, but again showing severe spinal canal stenosis and
neural foraminal narrowing at L2-L3 and grade 1 L4-L5
anterolisthesis.
3.  Aortic Atherosclerosis (WW137-HO6.6).

## 2017-11-20 DIAGNOSIS — M7062 Trochanteric bursitis, left hip: Secondary | ICD-10-CM | POA: Diagnosis not present

## 2017-11-24 DIAGNOSIS — M6281 Muscle weakness (generalized): Secondary | ICD-10-CM | POA: Diagnosis not present

## 2017-11-24 DIAGNOSIS — M25552 Pain in left hip: Secondary | ICD-10-CM | POA: Diagnosis not present

## 2017-11-24 DIAGNOSIS — M7062 Trochanteric bursitis, left hip: Secondary | ICD-10-CM | POA: Diagnosis not present

## 2017-11-24 IMAGING — RF DG C-ARM 61-120 MIN
1 series · 5 of 5 positions shown · non-contrast
Comparison: None.

CLINICAL DATA: L2-L5 interbody fusion.

EXAM:
LUMBAR SPINE - COMPLETE 4+ VIEW; DG C-ARM 61-120 MIN

[Series 1: run · 5 of 5 slices shown]
[im 1/5]
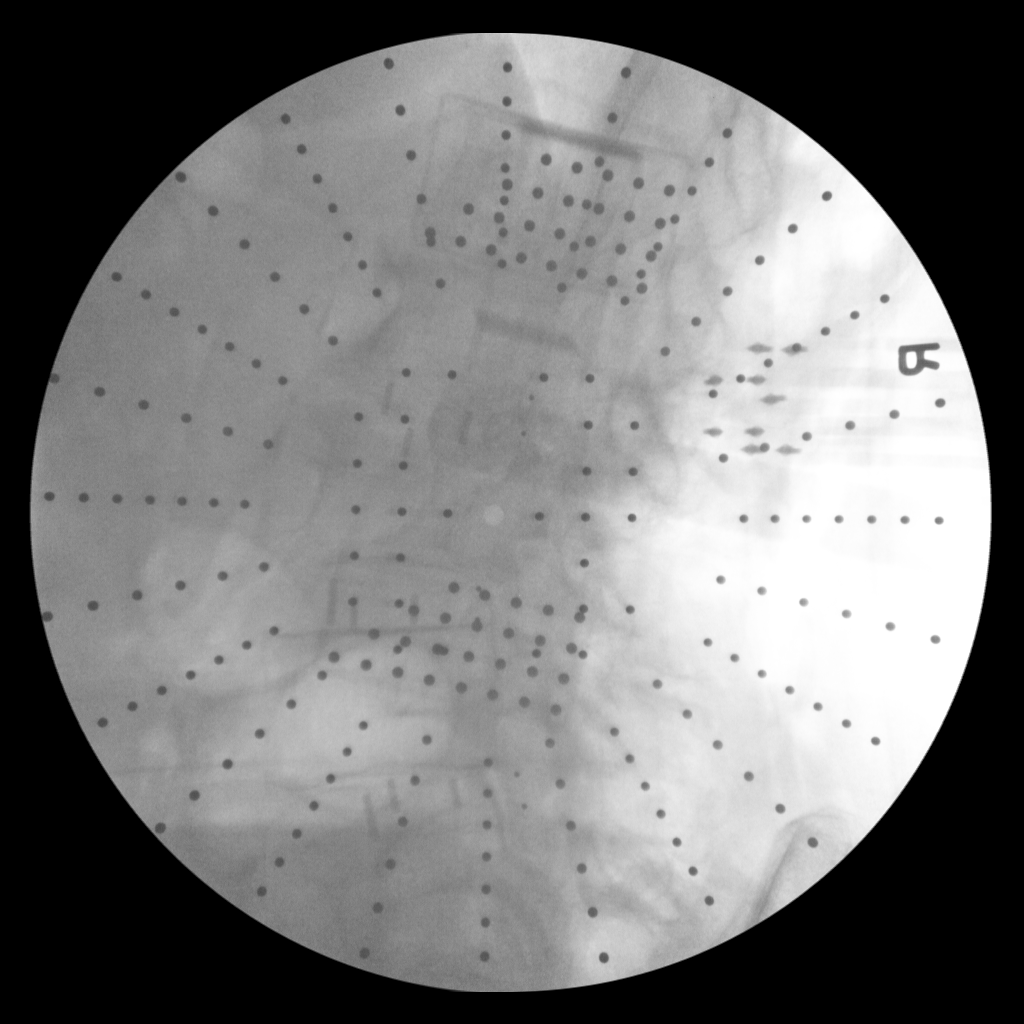
[im 2/5]
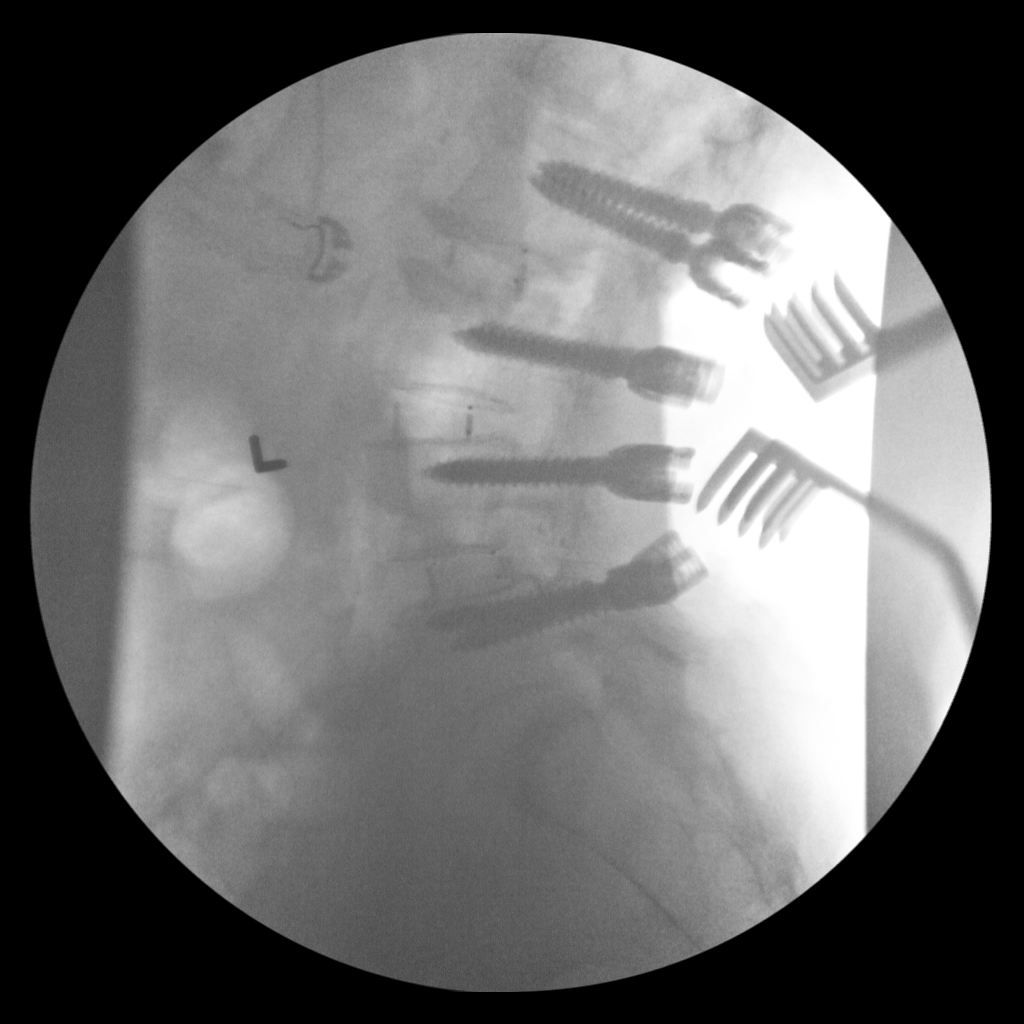
[im 3/5]
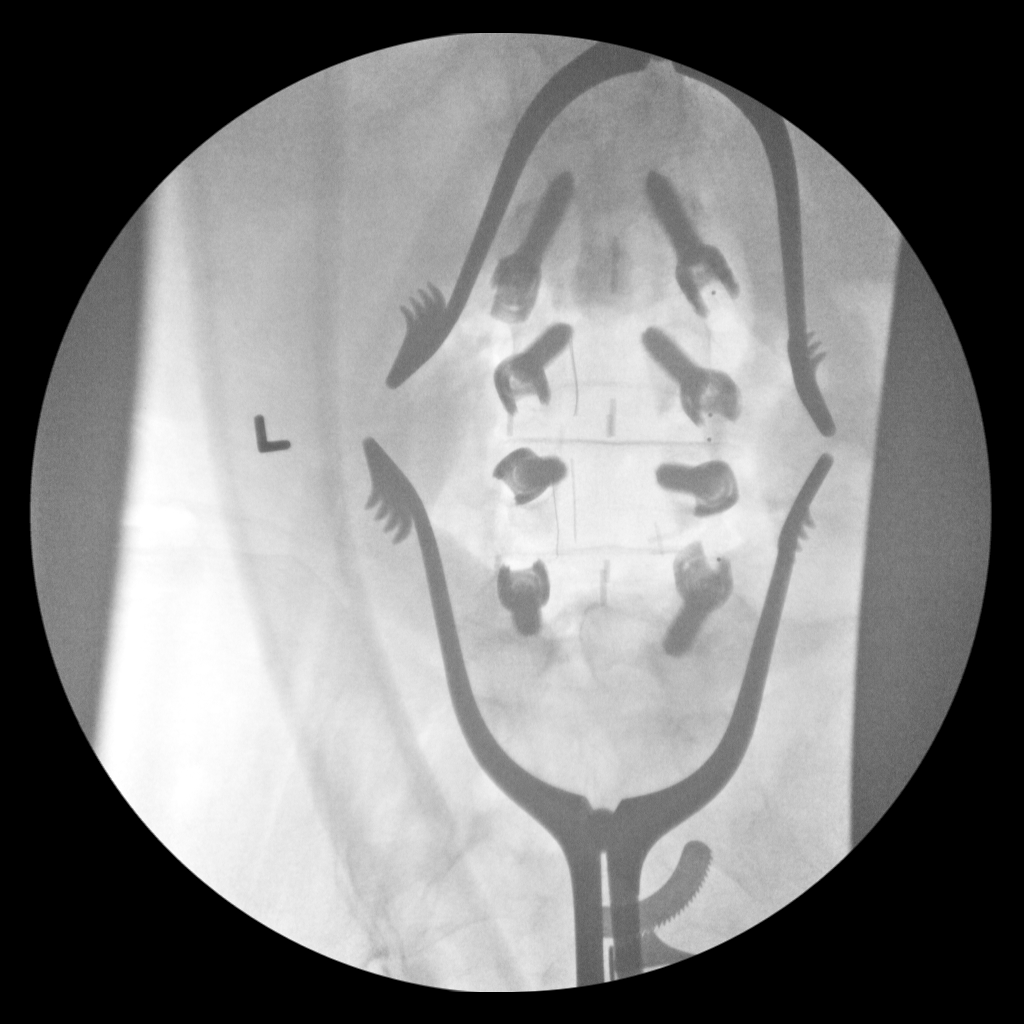
[im 4/5]
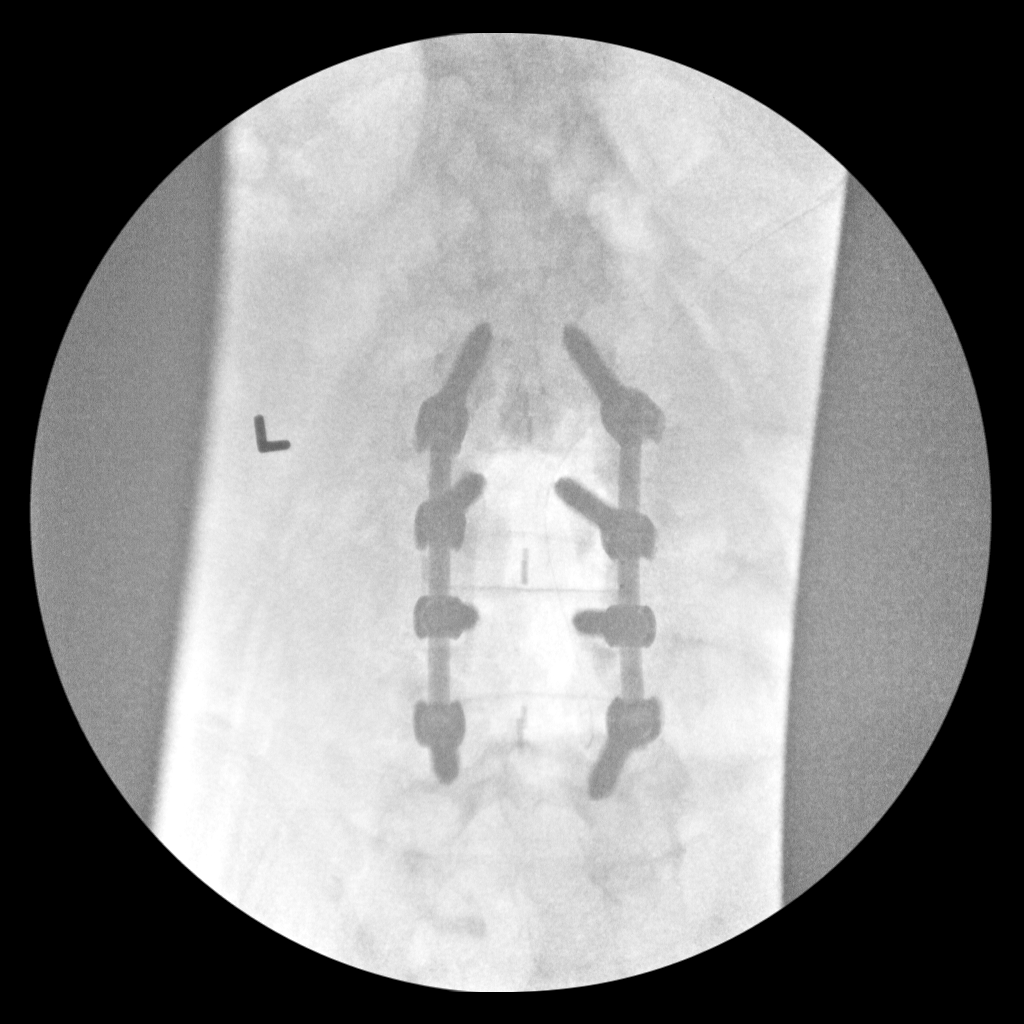
[im 5/5]
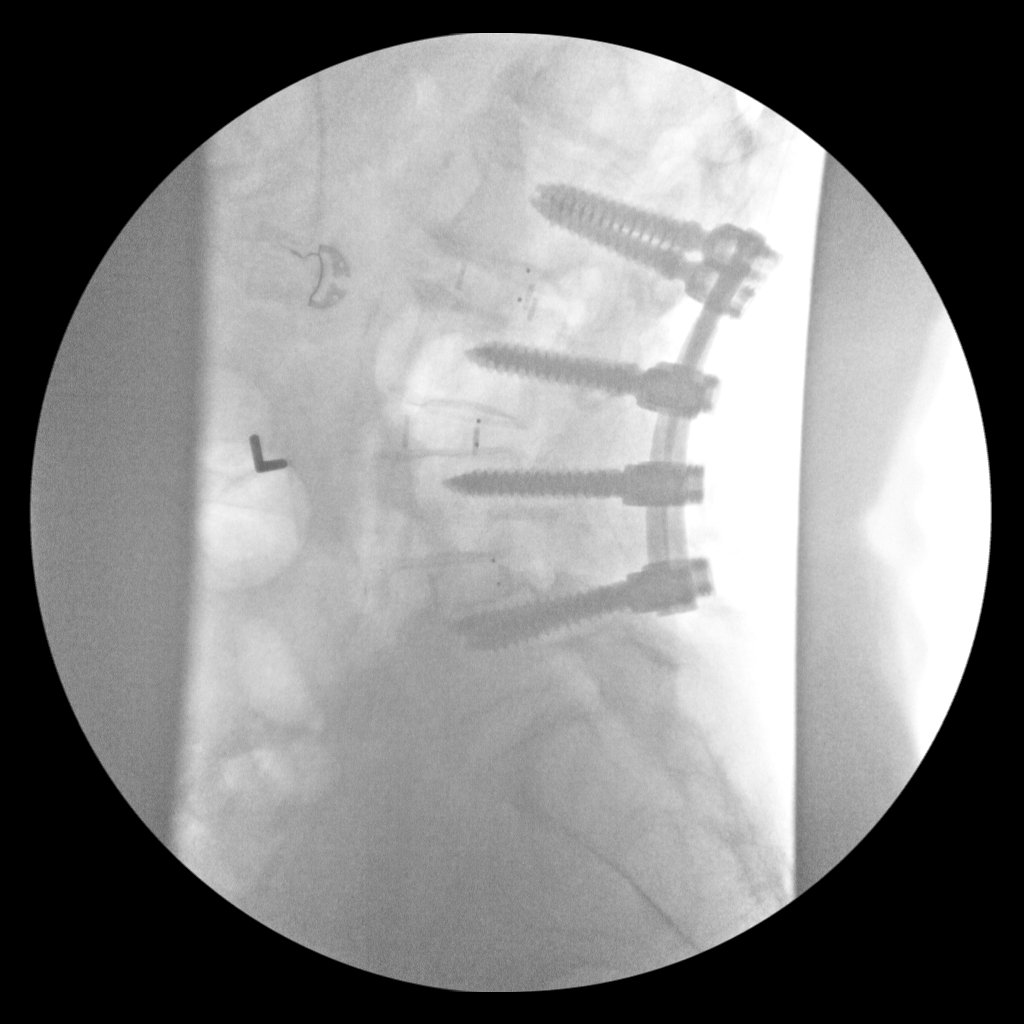

[5 of 5 positions shown; findings below may reference images not displayed]

FINDINGS: Examination demonstrates posterior fusion hardware intact from L2
through L5 with interbody fusion from L2-L5. Mild spondylosis of
lumbar spine. Recommend correlation with findings at the time of the
procedure.
IMPRESSION: Evidence patient's L2-L5 interbody fusion with hardware intact.

## 2017-11-30 DIAGNOSIS — Z961 Presence of intraocular lens: Secondary | ICD-10-CM | POA: Diagnosis not present

## 2017-11-30 DIAGNOSIS — M6281 Muscle weakness (generalized): Secondary | ICD-10-CM | POA: Diagnosis not present

## 2017-11-30 DIAGNOSIS — H04123 Dry eye syndrome of bilateral lacrimal glands: Secondary | ICD-10-CM | POA: Diagnosis not present

## 2017-11-30 DIAGNOSIS — M7062 Trochanteric bursitis, left hip: Secondary | ICD-10-CM | POA: Diagnosis not present

## 2017-11-30 DIAGNOSIS — M25552 Pain in left hip: Secondary | ICD-10-CM | POA: Diagnosis not present

## 2017-11-30 DIAGNOSIS — H40013 Open angle with borderline findings, low risk, bilateral: Secondary | ICD-10-CM | POA: Diagnosis not present

## 2017-11-30 DIAGNOSIS — E119 Type 2 diabetes mellitus without complications: Secondary | ICD-10-CM | POA: Diagnosis not present

## 2017-12-07 DIAGNOSIS — M25552 Pain in left hip: Secondary | ICD-10-CM | POA: Diagnosis not present

## 2017-12-07 DIAGNOSIS — M6281 Muscle weakness (generalized): Secondary | ICD-10-CM | POA: Diagnosis not present

## 2017-12-07 DIAGNOSIS — M7062 Trochanteric bursitis, left hip: Secondary | ICD-10-CM | POA: Diagnosis not present

## 2017-12-13 DIAGNOSIS — M25552 Pain in left hip: Secondary | ICD-10-CM | POA: Diagnosis not present

## 2017-12-13 DIAGNOSIS — M7062 Trochanteric bursitis, left hip: Secondary | ICD-10-CM | POA: Diagnosis not present

## 2017-12-13 DIAGNOSIS — M6281 Muscle weakness (generalized): Secondary | ICD-10-CM | POA: Diagnosis not present

## 2017-12-20 DIAGNOSIS — M25552 Pain in left hip: Secondary | ICD-10-CM | POA: Diagnosis not present

## 2017-12-20 DIAGNOSIS — M7062 Trochanteric bursitis, left hip: Secondary | ICD-10-CM | POA: Diagnosis not present

## 2017-12-20 DIAGNOSIS — M6281 Muscle weakness (generalized): Secondary | ICD-10-CM | POA: Diagnosis not present

## 2017-12-26 DIAGNOSIS — M6281 Muscle weakness (generalized): Secondary | ICD-10-CM | POA: Diagnosis not present

## 2017-12-26 DIAGNOSIS — M25552 Pain in left hip: Secondary | ICD-10-CM | POA: Diagnosis not present

## 2017-12-26 DIAGNOSIS — M7062 Trochanteric bursitis, left hip: Secondary | ICD-10-CM | POA: Diagnosis not present

## 2018-01-09 DIAGNOSIS — E039 Hypothyroidism, unspecified: Secondary | ICD-10-CM | POA: Diagnosis not present

## 2018-01-09 DIAGNOSIS — E663 Overweight: Secondary | ICD-10-CM | POA: Diagnosis not present

## 2018-01-09 DIAGNOSIS — Z6828 Body mass index (BMI) 28.0-28.9, adult: Secondary | ICD-10-CM | POA: Diagnosis not present

## 2018-01-09 DIAGNOSIS — Z79899 Other long term (current) drug therapy: Secondary | ICD-10-CM | POA: Diagnosis not present

## 2018-01-09 DIAGNOSIS — E1122 Type 2 diabetes mellitus with diabetic chronic kidney disease: Secondary | ICD-10-CM | POA: Diagnosis not present

## 2018-01-09 DIAGNOSIS — R911 Solitary pulmonary nodule: Secondary | ICD-10-CM | POA: Diagnosis not present

## 2018-01-09 DIAGNOSIS — N183 Chronic kidney disease, stage 3 (moderate): Secondary | ICD-10-CM | POA: Diagnosis not present

## 2018-01-09 DIAGNOSIS — M1A9XX0 Chronic gout, unspecified, without tophus (tophi): Secondary | ICD-10-CM | POA: Diagnosis not present

## 2018-01-10 DIAGNOSIS — R911 Solitary pulmonary nodule: Secondary | ICD-10-CM | POA: Diagnosis not present

## 2018-01-10 DIAGNOSIS — N281 Cyst of kidney, acquired: Secondary | ICD-10-CM | POA: Diagnosis not present

## 2018-01-10 DIAGNOSIS — I700 Atherosclerosis of aorta: Secondary | ICD-10-CM | POA: Diagnosis not present

## 2018-01-18 DIAGNOSIS — L821 Other seborrheic keratosis: Secondary | ICD-10-CM | POA: Diagnosis not present

## 2018-01-18 DIAGNOSIS — Z85820 Personal history of malignant melanoma of skin: Secondary | ICD-10-CM | POA: Diagnosis not present

## 2018-01-18 DIAGNOSIS — D2239 Melanocytic nevi of other parts of face: Secondary | ICD-10-CM | POA: Diagnosis not present

## 2018-01-18 DIAGNOSIS — D225 Melanocytic nevi of trunk: Secondary | ICD-10-CM | POA: Diagnosis not present

## 2018-02-13 DIAGNOSIS — R748 Abnormal levels of other serum enzymes: Secondary | ICD-10-CM | POA: Diagnosis not present

## 2018-02-19 DIAGNOSIS — Z6828 Body mass index (BMI) 28.0-28.9, adult: Secondary | ICD-10-CM | POA: Diagnosis not present

## 2018-02-19 DIAGNOSIS — R062 Wheezing: Secondary | ICD-10-CM | POA: Diagnosis not present

## 2018-02-19 DIAGNOSIS — R0982 Postnasal drip: Secondary | ICD-10-CM | POA: Diagnosis not present

## 2018-02-19 DIAGNOSIS — R05 Cough: Secondary | ICD-10-CM | POA: Diagnosis not present

## 2018-03-06 DIAGNOSIS — R05 Cough: Secondary | ICD-10-CM | POA: Diagnosis not present

## 2018-04-03 ENCOUNTER — Other Ambulatory Visit: Payer: Self-pay

## 2018-04-17 DIAGNOSIS — Z23 Encounter for immunization: Secondary | ICD-10-CM | POA: Diagnosis not present

## 2018-04-19 DIAGNOSIS — Z6828 Body mass index (BMI) 28.0-28.9, adult: Secondary | ICD-10-CM | POA: Diagnosis not present

## 2018-04-19 DIAGNOSIS — Z79899 Other long term (current) drug therapy: Secondary | ICD-10-CM | POA: Diagnosis not present

## 2018-04-19 DIAGNOSIS — E039 Hypothyroidism, unspecified: Secondary | ICD-10-CM | POA: Diagnosis not present

## 2018-04-19 DIAGNOSIS — E663 Overweight: Secondary | ICD-10-CM | POA: Diagnosis not present

## 2018-04-19 DIAGNOSIS — M1A9XX0 Chronic gout, unspecified, without tophus (tophi): Secondary | ICD-10-CM | POA: Diagnosis not present

## 2018-04-19 DIAGNOSIS — E1122 Type 2 diabetes mellitus with diabetic chronic kidney disease: Secondary | ICD-10-CM | POA: Diagnosis not present

## 2018-04-19 DIAGNOSIS — K582 Mixed irritable bowel syndrome: Secondary | ICD-10-CM | POA: Diagnosis not present

## 2018-04-19 DIAGNOSIS — I700 Atherosclerosis of aorta: Secondary | ICD-10-CM | POA: Diagnosis not present

## 2018-04-19 DIAGNOSIS — N183 Chronic kidney disease, stage 3 (moderate): Secondary | ICD-10-CM | POA: Diagnosis not present

## 2018-05-14 DIAGNOSIS — E559 Vitamin D deficiency, unspecified: Secondary | ICD-10-CM | POA: Diagnosis not present

## 2018-05-14 DIAGNOSIS — M9080 Osteopathy in diseases classified elsewhere, unspecified site: Secondary | ICD-10-CM | POA: Diagnosis not present

## 2018-05-14 DIAGNOSIS — N183 Chronic kidney disease, stage 3 (moderate): Secondary | ICD-10-CM | POA: Diagnosis not present

## 2018-05-14 DIAGNOSIS — E889 Metabolic disorder, unspecified: Secondary | ICD-10-CM | POA: Diagnosis not present

## 2018-05-14 DIAGNOSIS — I129 Hypertensive chronic kidney disease with stage 1 through stage 4 chronic kidney disease, or unspecified chronic kidney disease: Secondary | ICD-10-CM | POA: Diagnosis not present

## 2018-05-21 DIAGNOSIS — M9080 Osteopathy in diseases classified elsewhere, unspecified site: Secondary | ICD-10-CM | POA: Diagnosis not present

## 2018-05-21 DIAGNOSIS — E559 Vitamin D deficiency, unspecified: Secondary | ICD-10-CM | POA: Diagnosis not present

## 2018-05-21 DIAGNOSIS — E1122 Type 2 diabetes mellitus with diabetic chronic kidney disease: Secondary | ICD-10-CM | POA: Diagnosis not present

## 2018-05-21 DIAGNOSIS — N183 Chronic kidney disease, stage 3 (moderate): Secondary | ICD-10-CM | POA: Diagnosis not present

## 2018-05-21 DIAGNOSIS — E889 Metabolic disorder, unspecified: Secondary | ICD-10-CM | POA: Diagnosis not present

## 2018-05-21 DIAGNOSIS — I129 Hypertensive chronic kidney disease with stage 1 through stage 4 chronic kidney disease, or unspecified chronic kidney disease: Secondary | ICD-10-CM | POA: Diagnosis not present

## 2018-07-03 DIAGNOSIS — Z1231 Encounter for screening mammogram for malignant neoplasm of breast: Secondary | ICD-10-CM | POA: Diagnosis not present

## 2018-07-16 DIAGNOSIS — Z0000 Encounter for general adult medical examination without abnormal findings: Secondary | ICD-10-CM | POA: Diagnosis not present

## 2018-07-16 DIAGNOSIS — Z6828 Body mass index (BMI) 28.0-28.9, adult: Secondary | ICD-10-CM | POA: Diagnosis not present

## 2018-07-16 DIAGNOSIS — E663 Overweight: Secondary | ICD-10-CM | POA: Diagnosis not present

## 2018-07-16 DIAGNOSIS — E2839 Other primary ovarian failure: Secondary | ICD-10-CM | POA: Diagnosis not present

## 2018-07-16 DIAGNOSIS — Z1331 Encounter for screening for depression: Secondary | ICD-10-CM | POA: Diagnosis not present

## 2018-07-16 DIAGNOSIS — Z1339 Encounter for screening examination for other mental health and behavioral disorders: Secondary | ICD-10-CM | POA: Diagnosis not present

## 2018-10-15 DIAGNOSIS — M85851 Other specified disorders of bone density and structure, right thigh: Secondary | ICD-10-CM | POA: Diagnosis not present

## 2018-10-15 DIAGNOSIS — E2839 Other primary ovarian failure: Secondary | ICD-10-CM | POA: Diagnosis not present

## 2018-10-15 DIAGNOSIS — M85832 Other specified disorders of bone density and structure, left forearm: Secondary | ICD-10-CM | POA: Diagnosis not present

## 2018-10-30 DIAGNOSIS — Z6828 Body mass index (BMI) 28.0-28.9, adult: Secondary | ICD-10-CM | POA: Diagnosis not present

## 2018-10-30 DIAGNOSIS — Z79899 Other long term (current) drug therapy: Secondary | ICD-10-CM | POA: Diagnosis not present

## 2018-10-30 DIAGNOSIS — E039 Hypothyroidism, unspecified: Secondary | ICD-10-CM | POA: Diagnosis not present

## 2018-10-30 DIAGNOSIS — E663 Overweight: Secondary | ICD-10-CM | POA: Diagnosis not present

## 2018-10-30 DIAGNOSIS — E1122 Type 2 diabetes mellitus with diabetic chronic kidney disease: Secondary | ICD-10-CM | POA: Diagnosis not present

## 2018-10-30 DIAGNOSIS — M1A9XX0 Chronic gout, unspecified, without tophus (tophi): Secondary | ICD-10-CM | POA: Diagnosis not present

## 2018-11-02 DIAGNOSIS — M4317 Spondylolisthesis, lumbosacral region: Secondary | ICD-10-CM | POA: Diagnosis not present

## 2018-11-02 DIAGNOSIS — Z6828 Body mass index (BMI) 28.0-28.9, adult: Secondary | ICD-10-CM | POA: Diagnosis not present

## 2018-11-02 DIAGNOSIS — R030 Elevated blood-pressure reading, without diagnosis of hypertension: Secondary | ICD-10-CM | POA: Diagnosis not present

## 2018-11-20 DIAGNOSIS — N183 Chronic kidney disease, stage 3 (moderate): Secondary | ICD-10-CM | POA: Diagnosis not present

## 2018-11-20 DIAGNOSIS — I129 Hypertensive chronic kidney disease with stage 1 through stage 4 chronic kidney disease, or unspecified chronic kidney disease: Secondary | ICD-10-CM | POA: Diagnosis not present

## 2018-11-30 DIAGNOSIS — R936 Abnormal findings on diagnostic imaging of limbs: Secondary | ICD-10-CM | POA: Diagnosis not present

## 2018-11-30 DIAGNOSIS — M25541 Pain in joints of right hand: Secondary | ICD-10-CM | POA: Diagnosis not present

## 2018-11-30 DIAGNOSIS — M19041 Primary osteoarthritis, right hand: Secondary | ICD-10-CM | POA: Diagnosis not present

## 2018-11-30 DIAGNOSIS — Z6828 Body mass index (BMI) 28.0-28.9, adult: Secondary | ICD-10-CM | POA: Diagnosis not present

## 2018-11-30 DIAGNOSIS — R229 Localized swelling, mass and lump, unspecified: Secondary | ICD-10-CM | POA: Diagnosis not present

## 2018-12-03 DIAGNOSIS — H04123 Dry eye syndrome of bilateral lacrimal glands: Secondary | ICD-10-CM | POA: Diagnosis not present

## 2018-12-03 DIAGNOSIS — Z961 Presence of intraocular lens: Secondary | ICD-10-CM | POA: Diagnosis not present

## 2018-12-03 DIAGNOSIS — E119 Type 2 diabetes mellitus without complications: Secondary | ICD-10-CM | POA: Diagnosis not present

## 2018-12-03 DIAGNOSIS — H40013 Open angle with borderline findings, low risk, bilateral: Secondary | ICD-10-CM | POA: Diagnosis not present

## 2018-12-05 DIAGNOSIS — N183 Chronic kidney disease, stage 3 (moderate): Secondary | ICD-10-CM | POA: Diagnosis not present

## 2018-12-05 DIAGNOSIS — E1122 Type 2 diabetes mellitus with diabetic chronic kidney disease: Secondary | ICD-10-CM | POA: Diagnosis not present

## 2018-12-05 DIAGNOSIS — E876 Hypokalemia: Secondary | ICD-10-CM | POA: Diagnosis not present

## 2018-12-05 DIAGNOSIS — I129 Hypertensive chronic kidney disease with stage 1 through stage 4 chronic kidney disease, or unspecified chronic kidney disease: Secondary | ICD-10-CM | POA: Diagnosis not present

## 2018-12-05 DIAGNOSIS — M9080 Osteopathy in diseases classified elsewhere, unspecified site: Secondary | ICD-10-CM | POA: Diagnosis not present

## 2018-12-05 DIAGNOSIS — E559 Vitamin D deficiency, unspecified: Secondary | ICD-10-CM | POA: Diagnosis not present

## 2018-12-05 DIAGNOSIS — E889 Metabolic disorder, unspecified: Secondary | ICD-10-CM | POA: Diagnosis not present

## 2018-12-12 DIAGNOSIS — R922 Inconclusive mammogram: Secondary | ICD-10-CM | POA: Diagnosis not present

## 2018-12-12 DIAGNOSIS — N6489 Other specified disorders of breast: Secondary | ICD-10-CM | POA: Diagnosis not present

## 2018-12-12 DIAGNOSIS — R229 Localized swelling, mass and lump, unspecified: Secondary | ICD-10-CM | POA: Diagnosis not present

## 2019-01-24 DIAGNOSIS — M109 Gout, unspecified: Secondary | ICD-10-CM | POA: Diagnosis not present

## 2019-01-24 DIAGNOSIS — Z6828 Body mass index (BMI) 28.0-28.9, adult: Secondary | ICD-10-CM | POA: Diagnosis not present

## 2019-01-24 DIAGNOSIS — Z833 Family history of diabetes mellitus: Secondary | ICD-10-CM | POA: Diagnosis not present

## 2019-01-24 DIAGNOSIS — M25562 Pain in left knee: Secondary | ICD-10-CM | POA: Diagnosis not present

## 2019-01-24 DIAGNOSIS — M8580 Other specified disorders of bone density and structure, unspecified site: Secondary | ICD-10-CM | POA: Diagnosis not present

## 2019-01-24 DIAGNOSIS — E1122 Type 2 diabetes mellitus with diabetic chronic kidney disease: Secondary | ICD-10-CM | POA: Diagnosis not present

## 2019-01-24 DIAGNOSIS — S32502A Unspecified fracture of left pubis, initial encounter for closed fracture: Secondary | ICD-10-CM | POA: Diagnosis not present

## 2019-01-24 DIAGNOSIS — S32512A Fracture of superior rim of left pubis, initial encounter for closed fracture: Secondary | ICD-10-CM | POA: Diagnosis not present

## 2019-01-24 DIAGNOSIS — W19XXXA Unspecified fall, initial encounter: Secondary | ICD-10-CM | POA: Diagnosis not present

## 2019-01-24 DIAGNOSIS — Z96653 Presence of artificial knee joint, bilateral: Secondary | ICD-10-CM | POA: Diagnosis not present

## 2019-01-24 DIAGNOSIS — Z87891 Personal history of nicotine dependence: Secondary | ICD-10-CM | POA: Diagnosis not present

## 2019-01-24 DIAGNOSIS — S32592A Other specified fracture of left pubis, initial encounter for closed fracture: Secondary | ICD-10-CM | POA: Diagnosis not present

## 2019-01-24 DIAGNOSIS — M25552 Pain in left hip: Secondary | ICD-10-CM | POA: Diagnosis not present

## 2019-01-24 DIAGNOSIS — Z882 Allergy status to sulfonamides status: Secondary | ICD-10-CM | POA: Diagnosis not present

## 2019-01-24 DIAGNOSIS — S8992XA Unspecified injury of left lower leg, initial encounter: Secondary | ICD-10-CM | POA: Diagnosis not present

## 2019-01-24 DIAGNOSIS — S79912A Unspecified injury of left hip, initial encounter: Secondary | ICD-10-CM | POA: Diagnosis not present

## 2019-01-24 DIAGNOSIS — Z880 Allergy status to penicillin: Secondary | ICD-10-CM | POA: Diagnosis not present

## 2019-01-24 DIAGNOSIS — E876 Hypokalemia: Secondary | ICD-10-CM | POA: Diagnosis not present

## 2019-01-24 DIAGNOSIS — E785 Hyperlipidemia, unspecified: Secondary | ICD-10-CM | POA: Diagnosis not present

## 2019-01-24 DIAGNOSIS — M1990 Unspecified osteoarthritis, unspecified site: Secondary | ICD-10-CM | POA: Diagnosis not present

## 2019-01-24 DIAGNOSIS — S7002XA Contusion of left hip, initial encounter: Secondary | ICD-10-CM | POA: Diagnosis not present

## 2019-01-24 DIAGNOSIS — N183 Chronic kidney disease, stage 3 (moderate): Secondary | ICD-10-CM | POA: Diagnosis not present

## 2019-01-24 DIAGNOSIS — M069 Rheumatoid arthritis, unspecified: Secondary | ICD-10-CM | POA: Diagnosis not present

## 2019-01-24 DIAGNOSIS — Z981 Arthrodesis status: Secondary | ICD-10-CM | POA: Diagnosis not present

## 2019-01-24 DIAGNOSIS — M549 Dorsalgia, unspecified: Secondary | ICD-10-CM | POA: Diagnosis not present

## 2019-01-24 DIAGNOSIS — R2689 Other abnormalities of gait and mobility: Secondary | ICD-10-CM | POA: Diagnosis not present

## 2019-01-24 DIAGNOSIS — S50312A Abrasion of left elbow, initial encounter: Secondary | ICD-10-CM | POA: Diagnosis not present

## 2019-01-24 DIAGNOSIS — Z20828 Contact with and (suspected) exposure to other viral communicable diseases: Secondary | ICD-10-CM | POA: Diagnosis not present

## 2019-01-24 DIAGNOSIS — S3219XA Other fracture of sacrum, initial encounter for closed fracture: Secondary | ICD-10-CM | POA: Diagnosis not present

## 2019-01-24 DIAGNOSIS — Z885 Allergy status to narcotic agent status: Secondary | ICD-10-CM | POA: Diagnosis not present

## 2019-01-24 DIAGNOSIS — R262 Difficulty in walking, not elsewhere classified: Secondary | ICD-10-CM | POA: Diagnosis not present

## 2019-01-24 DIAGNOSIS — Z96652 Presence of left artificial knee joint: Secondary | ICD-10-CM | POA: Diagnosis not present

## 2019-01-24 DIAGNOSIS — E039 Hypothyroidism, unspecified: Secondary | ICD-10-CM | POA: Diagnosis not present

## 2019-01-25 DIAGNOSIS — W19XXXA Unspecified fall, initial encounter: Secondary | ICD-10-CM | POA: Diagnosis not present

## 2019-01-25 DIAGNOSIS — Y929 Unspecified place or not applicable: Secondary | ICD-10-CM | POA: Diagnosis not present

## 2019-01-25 DIAGNOSIS — S3210XA Unspecified fracture of sacrum, initial encounter for closed fracture: Secondary | ICD-10-CM | POA: Diagnosis not present

## 2019-01-25 DIAGNOSIS — E119 Type 2 diabetes mellitus without complications: Secondary | ICD-10-CM | POA: Diagnosis not present

## 2019-01-25 DIAGNOSIS — E039 Hypothyroidism, unspecified: Secondary | ICD-10-CM | POA: Diagnosis not present

## 2019-01-25 DIAGNOSIS — Y9301 Activity, walking, marching and hiking: Secondary | ICD-10-CM | POA: Diagnosis not present

## 2019-01-25 DIAGNOSIS — S32592A Other specified fracture of left pubis, initial encounter for closed fracture: Secondary | ICD-10-CM | POA: Diagnosis not present

## 2019-01-25 DIAGNOSIS — E785 Hyperlipidemia, unspecified: Secondary | ICD-10-CM | POA: Diagnosis not present

## 2019-01-25 DIAGNOSIS — R609 Edema, unspecified: Secondary | ICD-10-CM | POA: Diagnosis not present

## 2019-01-26 DIAGNOSIS — E039 Hypothyroidism, unspecified: Secondary | ICD-10-CM | POA: Diagnosis not present

## 2019-01-26 DIAGNOSIS — E1122 Type 2 diabetes mellitus with diabetic chronic kidney disease: Secondary | ICD-10-CM | POA: Diagnosis not present

## 2019-01-26 DIAGNOSIS — N189 Chronic kidney disease, unspecified: Secondary | ICD-10-CM | POA: Diagnosis not present

## 2019-01-26 DIAGNOSIS — M109 Gout, unspecified: Secondary | ICD-10-CM | POA: Diagnosis not present

## 2019-01-26 DIAGNOSIS — Z6828 Body mass index (BMI) 28.0-28.9, adult: Secondary | ICD-10-CM | POA: Diagnosis not present

## 2019-01-26 DIAGNOSIS — S32591A Other specified fracture of right pubis, initial encounter for closed fracture: Secondary | ICD-10-CM | POA: Diagnosis not present

## 2019-01-28 DIAGNOSIS — I129 Hypertensive chronic kidney disease with stage 1 through stage 4 chronic kidney disease, or unspecified chronic kidney disease: Secondary | ICD-10-CM | POA: Diagnosis not present

## 2019-01-28 DIAGNOSIS — Z981 Arthrodesis status: Secondary | ICD-10-CM | POA: Diagnosis not present

## 2019-01-28 DIAGNOSIS — Z9181 History of falling: Secondary | ICD-10-CM | POA: Diagnosis not present

## 2019-01-28 DIAGNOSIS — N183 Chronic kidney disease, stage 3 (moderate): Secondary | ICD-10-CM | POA: Diagnosis not present

## 2019-01-28 DIAGNOSIS — E039 Hypothyroidism, unspecified: Secondary | ICD-10-CM | POA: Diagnosis not present

## 2019-01-28 DIAGNOSIS — M1990 Unspecified osteoarthritis, unspecified site: Secondary | ICD-10-CM | POA: Diagnosis not present

## 2019-01-28 DIAGNOSIS — Z90710 Acquired absence of both cervix and uterus: Secondary | ICD-10-CM | POA: Diagnosis not present

## 2019-01-28 DIAGNOSIS — Z9842 Cataract extraction status, left eye: Secondary | ICD-10-CM | POA: Diagnosis not present

## 2019-01-28 DIAGNOSIS — E1122 Type 2 diabetes mellitus with diabetic chronic kidney disease: Secondary | ICD-10-CM | POA: Diagnosis not present

## 2019-01-28 DIAGNOSIS — E559 Vitamin D deficiency, unspecified: Secondary | ICD-10-CM | POA: Diagnosis not present

## 2019-01-28 DIAGNOSIS — M8580 Other specified disorders of bone density and structure, unspecified site: Secondary | ICD-10-CM | POA: Diagnosis not present

## 2019-01-28 DIAGNOSIS — Z85828 Personal history of other malignant neoplasm of skin: Secondary | ICD-10-CM | POA: Diagnosis not present

## 2019-01-28 DIAGNOSIS — Z87891 Personal history of nicotine dependence: Secondary | ICD-10-CM | POA: Diagnosis not present

## 2019-01-28 DIAGNOSIS — Z96653 Presence of artificial knee joint, bilateral: Secondary | ICD-10-CM | POA: Diagnosis not present

## 2019-01-28 DIAGNOSIS — E785 Hyperlipidemia, unspecified: Secondary | ICD-10-CM | POA: Diagnosis not present

## 2019-01-28 DIAGNOSIS — D631 Anemia in chronic kidney disease: Secondary | ICD-10-CM | POA: Diagnosis not present

## 2019-01-28 DIAGNOSIS — S32512D Fracture of superior rim of left pubis, subsequent encounter for fracture with routine healing: Secondary | ICD-10-CM | POA: Diagnosis not present

## 2019-01-28 DIAGNOSIS — Z9841 Cataract extraction status, right eye: Secondary | ICD-10-CM | POA: Diagnosis not present

## 2019-01-28 DIAGNOSIS — W19XXXD Unspecified fall, subsequent encounter: Secondary | ICD-10-CM | POA: Diagnosis not present

## 2019-01-28 DIAGNOSIS — M109 Gout, unspecified: Secondary | ICD-10-CM | POA: Diagnosis not present

## 2019-01-28 DIAGNOSIS — M47896 Other spondylosis, lumbar region: Secondary | ICD-10-CM | POA: Diagnosis not present

## 2019-01-29 DIAGNOSIS — M1990 Unspecified osteoarthritis, unspecified site: Secondary | ICD-10-CM | POA: Diagnosis not present

## 2019-01-29 DIAGNOSIS — N183 Chronic kidney disease, stage 3 (moderate): Secondary | ICD-10-CM | POA: Diagnosis not present

## 2019-01-29 DIAGNOSIS — I129 Hypertensive chronic kidney disease with stage 1 through stage 4 chronic kidney disease, or unspecified chronic kidney disease: Secondary | ICD-10-CM | POA: Diagnosis not present

## 2019-01-29 DIAGNOSIS — M47896 Other spondylosis, lumbar region: Secondary | ICD-10-CM | POA: Diagnosis not present

## 2019-01-29 DIAGNOSIS — E1122 Type 2 diabetes mellitus with diabetic chronic kidney disease: Secondary | ICD-10-CM | POA: Diagnosis not present

## 2019-01-29 DIAGNOSIS — S32512D Fracture of superior rim of left pubis, subsequent encounter for fracture with routine healing: Secondary | ICD-10-CM | POA: Diagnosis not present

## 2019-01-31 DIAGNOSIS — M1990 Unspecified osteoarthritis, unspecified site: Secondary | ICD-10-CM | POA: Diagnosis not present

## 2019-01-31 DIAGNOSIS — N183 Chronic kidney disease, stage 3 (moderate): Secondary | ICD-10-CM | POA: Diagnosis not present

## 2019-01-31 DIAGNOSIS — I129 Hypertensive chronic kidney disease with stage 1 through stage 4 chronic kidney disease, or unspecified chronic kidney disease: Secondary | ICD-10-CM | POA: Diagnosis not present

## 2019-01-31 DIAGNOSIS — S32512D Fracture of superior rim of left pubis, subsequent encounter for fracture with routine healing: Secondary | ICD-10-CM | POA: Diagnosis not present

## 2019-01-31 DIAGNOSIS — E1122 Type 2 diabetes mellitus with diabetic chronic kidney disease: Secondary | ICD-10-CM | POA: Diagnosis not present

## 2019-01-31 DIAGNOSIS — M47896 Other spondylosis, lumbar region: Secondary | ICD-10-CM | POA: Diagnosis not present

## 2019-02-04 DIAGNOSIS — M47896 Other spondylosis, lumbar region: Secondary | ICD-10-CM | POA: Diagnosis not present

## 2019-02-04 DIAGNOSIS — E1122 Type 2 diabetes mellitus with diabetic chronic kidney disease: Secondary | ICD-10-CM | POA: Diagnosis not present

## 2019-02-04 DIAGNOSIS — N183 Chronic kidney disease, stage 3 (moderate): Secondary | ICD-10-CM | POA: Diagnosis not present

## 2019-02-04 DIAGNOSIS — M1990 Unspecified osteoarthritis, unspecified site: Secondary | ICD-10-CM | POA: Diagnosis not present

## 2019-02-04 DIAGNOSIS — S32512D Fracture of superior rim of left pubis, subsequent encounter for fracture with routine healing: Secondary | ICD-10-CM | POA: Diagnosis not present

## 2019-02-04 DIAGNOSIS — I129 Hypertensive chronic kidney disease with stage 1 through stage 4 chronic kidney disease, or unspecified chronic kidney disease: Secondary | ICD-10-CM | POA: Diagnosis not present

## 2019-02-05 DIAGNOSIS — S32592D Other specified fracture of left pubis, subsequent encounter for fracture with routine healing: Secondary | ICD-10-CM | POA: Diagnosis not present

## 2019-02-05 DIAGNOSIS — S32512D Fracture of superior rim of left pubis, subsequent encounter for fracture with routine healing: Secondary | ICD-10-CM | POA: Diagnosis not present

## 2019-02-05 DIAGNOSIS — S3210XD Unspecified fracture of sacrum, subsequent encounter for fracture with routine healing: Secondary | ICD-10-CM | POA: Diagnosis not present

## 2019-02-05 DIAGNOSIS — S32592A Other specified fracture of left pubis, initial encounter for closed fracture: Secondary | ICD-10-CM | POA: Diagnosis not present

## 2019-02-05 DIAGNOSIS — S3210XA Unspecified fracture of sacrum, initial encounter for closed fracture: Secondary | ICD-10-CM | POA: Diagnosis not present

## 2019-02-06 DIAGNOSIS — M47896 Other spondylosis, lumbar region: Secondary | ICD-10-CM | POA: Diagnosis not present

## 2019-02-06 DIAGNOSIS — E1122 Type 2 diabetes mellitus with diabetic chronic kidney disease: Secondary | ICD-10-CM | POA: Diagnosis not present

## 2019-02-06 DIAGNOSIS — N183 Chronic kidney disease, stage 3 (moderate): Secondary | ICD-10-CM | POA: Diagnosis not present

## 2019-02-06 DIAGNOSIS — I129 Hypertensive chronic kidney disease with stage 1 through stage 4 chronic kidney disease, or unspecified chronic kidney disease: Secondary | ICD-10-CM | POA: Diagnosis not present

## 2019-02-06 DIAGNOSIS — M1990 Unspecified osteoarthritis, unspecified site: Secondary | ICD-10-CM | POA: Diagnosis not present

## 2019-02-06 DIAGNOSIS — S32512D Fracture of superior rim of left pubis, subsequent encounter for fracture with routine healing: Secondary | ICD-10-CM | POA: Diagnosis not present

## 2019-02-11 DIAGNOSIS — E039 Hypothyroidism, unspecified: Secondary | ICD-10-CM | POA: Diagnosis not present

## 2019-02-11 DIAGNOSIS — E1122 Type 2 diabetes mellitus with diabetic chronic kidney disease: Secondary | ICD-10-CM | POA: Diagnosis not present

## 2019-02-11 DIAGNOSIS — Z79899 Other long term (current) drug therapy: Secondary | ICD-10-CM | POA: Diagnosis not present

## 2019-02-11 DIAGNOSIS — I700 Atherosclerosis of aorta: Secondary | ICD-10-CM | POA: Diagnosis not present

## 2019-02-11 DIAGNOSIS — Z6829 Body mass index (BMI) 29.0-29.9, adult: Secondary | ICD-10-CM | POA: Diagnosis not present

## 2019-02-12 DIAGNOSIS — S32512D Fracture of superior rim of left pubis, subsequent encounter for fracture with routine healing: Secondary | ICD-10-CM | POA: Diagnosis not present

## 2019-02-12 DIAGNOSIS — M47896 Other spondylosis, lumbar region: Secondary | ICD-10-CM | POA: Diagnosis not present

## 2019-02-12 DIAGNOSIS — E1122 Type 2 diabetes mellitus with diabetic chronic kidney disease: Secondary | ICD-10-CM | POA: Diagnosis not present

## 2019-02-12 DIAGNOSIS — I129 Hypertensive chronic kidney disease with stage 1 through stage 4 chronic kidney disease, or unspecified chronic kidney disease: Secondary | ICD-10-CM | POA: Diagnosis not present

## 2019-02-12 DIAGNOSIS — N183 Chronic kidney disease, stage 3 (moderate): Secondary | ICD-10-CM | POA: Diagnosis not present

## 2019-02-12 DIAGNOSIS — M1990 Unspecified osteoarthritis, unspecified site: Secondary | ICD-10-CM | POA: Diagnosis not present

## 2019-02-14 DIAGNOSIS — N183 Chronic kidney disease, stage 3 (moderate): Secondary | ICD-10-CM | POA: Diagnosis not present

## 2019-02-14 DIAGNOSIS — E1122 Type 2 diabetes mellitus with diabetic chronic kidney disease: Secondary | ICD-10-CM | POA: Diagnosis not present

## 2019-02-14 DIAGNOSIS — M1990 Unspecified osteoarthritis, unspecified site: Secondary | ICD-10-CM | POA: Diagnosis not present

## 2019-02-14 DIAGNOSIS — S32512D Fracture of superior rim of left pubis, subsequent encounter for fracture with routine healing: Secondary | ICD-10-CM | POA: Diagnosis not present

## 2019-02-14 DIAGNOSIS — I129 Hypertensive chronic kidney disease with stage 1 through stage 4 chronic kidney disease, or unspecified chronic kidney disease: Secondary | ICD-10-CM | POA: Diagnosis not present

## 2019-02-14 DIAGNOSIS — M47896 Other spondylosis, lumbar region: Secondary | ICD-10-CM | POA: Diagnosis not present

## 2019-02-18 DIAGNOSIS — Z23 Encounter for immunization: Secondary | ICD-10-CM | POA: Diagnosis not present

## 2019-02-19 DIAGNOSIS — S32512D Fracture of superior rim of left pubis, subsequent encounter for fracture with routine healing: Secondary | ICD-10-CM | POA: Diagnosis not present

## 2019-02-19 DIAGNOSIS — N183 Chronic kidney disease, stage 3 (moderate): Secondary | ICD-10-CM | POA: Diagnosis not present

## 2019-02-19 DIAGNOSIS — E1122 Type 2 diabetes mellitus with diabetic chronic kidney disease: Secondary | ICD-10-CM | POA: Diagnosis not present

## 2019-02-19 DIAGNOSIS — M1990 Unspecified osteoarthritis, unspecified site: Secondary | ICD-10-CM | POA: Diagnosis not present

## 2019-02-19 DIAGNOSIS — M47896 Other spondylosis, lumbar region: Secondary | ICD-10-CM | POA: Diagnosis not present

## 2019-02-19 DIAGNOSIS — I129 Hypertensive chronic kidney disease with stage 1 through stage 4 chronic kidney disease, or unspecified chronic kidney disease: Secondary | ICD-10-CM | POA: Diagnosis not present

## 2019-02-26 DIAGNOSIS — N183 Chronic kidney disease, stage 3 (moderate): Secondary | ICD-10-CM | POA: Diagnosis not present

## 2019-02-26 DIAGNOSIS — M1990 Unspecified osteoarthritis, unspecified site: Secondary | ICD-10-CM | POA: Diagnosis not present

## 2019-02-26 DIAGNOSIS — S32512D Fracture of superior rim of left pubis, subsequent encounter for fracture with routine healing: Secondary | ICD-10-CM | POA: Diagnosis not present

## 2019-02-26 DIAGNOSIS — M47896 Other spondylosis, lumbar region: Secondary | ICD-10-CM | POA: Diagnosis not present

## 2019-02-26 DIAGNOSIS — I129 Hypertensive chronic kidney disease with stage 1 through stage 4 chronic kidney disease, or unspecified chronic kidney disease: Secondary | ICD-10-CM | POA: Diagnosis not present

## 2019-02-26 DIAGNOSIS — E1122 Type 2 diabetes mellitus with diabetic chronic kidney disease: Secondary | ICD-10-CM | POA: Diagnosis not present

## 2019-02-27 DIAGNOSIS — Z981 Arthrodesis status: Secondary | ICD-10-CM | POA: Diagnosis not present

## 2019-02-27 DIAGNOSIS — E559 Vitamin D deficiency, unspecified: Secondary | ICD-10-CM | POA: Diagnosis not present

## 2019-02-27 DIAGNOSIS — Z85828 Personal history of other malignant neoplasm of skin: Secondary | ICD-10-CM | POA: Diagnosis not present

## 2019-02-27 DIAGNOSIS — N183 Chronic kidney disease, stage 3 unspecified: Secondary | ICD-10-CM | POA: Diagnosis not present

## 2019-02-27 DIAGNOSIS — I129 Hypertensive chronic kidney disease with stage 1 through stage 4 chronic kidney disease, or unspecified chronic kidney disease: Secondary | ICD-10-CM | POA: Diagnosis not present

## 2019-02-27 DIAGNOSIS — Z9842 Cataract extraction status, left eye: Secondary | ICD-10-CM | POA: Diagnosis not present

## 2019-02-27 DIAGNOSIS — Z87891 Personal history of nicotine dependence: Secondary | ICD-10-CM | POA: Diagnosis not present

## 2019-02-27 DIAGNOSIS — S32512D Fracture of superior rim of left pubis, subsequent encounter for fracture with routine healing: Secondary | ICD-10-CM | POA: Diagnosis not present

## 2019-02-27 DIAGNOSIS — W19XXXD Unspecified fall, subsequent encounter: Secondary | ICD-10-CM | POA: Diagnosis not present

## 2019-02-27 DIAGNOSIS — M47896 Other spondylosis, lumbar region: Secondary | ICD-10-CM | POA: Diagnosis not present

## 2019-02-27 DIAGNOSIS — Z90710 Acquired absence of both cervix and uterus: Secondary | ICD-10-CM | POA: Diagnosis not present

## 2019-02-27 DIAGNOSIS — M8580 Other specified disorders of bone density and structure, unspecified site: Secondary | ICD-10-CM | POA: Diagnosis not present

## 2019-02-27 DIAGNOSIS — Z9181 History of falling: Secondary | ICD-10-CM | POA: Diagnosis not present

## 2019-02-27 DIAGNOSIS — M1990 Unspecified osteoarthritis, unspecified site: Secondary | ICD-10-CM | POA: Diagnosis not present

## 2019-02-27 DIAGNOSIS — E1122 Type 2 diabetes mellitus with diabetic chronic kidney disease: Secondary | ICD-10-CM | POA: Diagnosis not present

## 2019-02-27 DIAGNOSIS — Z96653 Presence of artificial knee joint, bilateral: Secondary | ICD-10-CM | POA: Diagnosis not present

## 2019-02-27 DIAGNOSIS — M109 Gout, unspecified: Secondary | ICD-10-CM | POA: Diagnosis not present

## 2019-02-27 DIAGNOSIS — E039 Hypothyroidism, unspecified: Secondary | ICD-10-CM | POA: Diagnosis not present

## 2019-02-27 DIAGNOSIS — E785 Hyperlipidemia, unspecified: Secondary | ICD-10-CM | POA: Diagnosis not present

## 2019-02-27 DIAGNOSIS — Z9841 Cataract extraction status, right eye: Secondary | ICD-10-CM | POA: Diagnosis not present

## 2019-02-27 DIAGNOSIS — D631 Anemia in chronic kidney disease: Secondary | ICD-10-CM | POA: Diagnosis not present

## 2019-03-05 DIAGNOSIS — S3210XD Unspecified fracture of sacrum, subsequent encounter for fracture with routine healing: Secondary | ICD-10-CM | POA: Diagnosis not present

## 2019-03-05 DIAGNOSIS — S32512A Fracture of superior rim of left pubis, initial encounter for closed fracture: Secondary | ICD-10-CM | POA: Diagnosis not present

## 2019-03-05 DIAGNOSIS — M5431 Sciatica, right side: Secondary | ICD-10-CM | POA: Diagnosis not present

## 2019-03-05 DIAGNOSIS — S3210XA Unspecified fracture of sacrum, initial encounter for closed fracture: Secondary | ICD-10-CM | POA: Diagnosis not present

## 2019-03-07 DIAGNOSIS — I129 Hypertensive chronic kidney disease with stage 1 through stage 4 chronic kidney disease, or unspecified chronic kidney disease: Secondary | ICD-10-CM | POA: Diagnosis not present

## 2019-03-07 DIAGNOSIS — M1990 Unspecified osteoarthritis, unspecified site: Secondary | ICD-10-CM | POA: Diagnosis not present

## 2019-03-07 DIAGNOSIS — M47896 Other spondylosis, lumbar region: Secondary | ICD-10-CM | POA: Diagnosis not present

## 2019-03-07 DIAGNOSIS — E1122 Type 2 diabetes mellitus with diabetic chronic kidney disease: Secondary | ICD-10-CM | POA: Diagnosis not present

## 2019-03-07 DIAGNOSIS — N183 Chronic kidney disease, stage 3 unspecified: Secondary | ICD-10-CM | POA: Diagnosis not present

## 2019-03-07 DIAGNOSIS — S32512D Fracture of superior rim of left pubis, subsequent encounter for fracture with routine healing: Secondary | ICD-10-CM | POA: Diagnosis not present

## 2019-03-12 DIAGNOSIS — E1122 Type 2 diabetes mellitus with diabetic chronic kidney disease: Secondary | ICD-10-CM | POA: Diagnosis not present

## 2019-03-12 DIAGNOSIS — M47896 Other spondylosis, lumbar region: Secondary | ICD-10-CM | POA: Diagnosis not present

## 2019-03-12 DIAGNOSIS — M1990 Unspecified osteoarthritis, unspecified site: Secondary | ICD-10-CM | POA: Diagnosis not present

## 2019-03-12 DIAGNOSIS — S32512D Fracture of superior rim of left pubis, subsequent encounter for fracture with routine healing: Secondary | ICD-10-CM | POA: Diagnosis not present

## 2019-03-12 DIAGNOSIS — I129 Hypertensive chronic kidney disease with stage 1 through stage 4 chronic kidney disease, or unspecified chronic kidney disease: Secondary | ICD-10-CM | POA: Diagnosis not present

## 2019-03-12 DIAGNOSIS — N183 Chronic kidney disease, stage 3 unspecified: Secondary | ICD-10-CM | POA: Diagnosis not present

## 2019-03-21 DIAGNOSIS — E1122 Type 2 diabetes mellitus with diabetic chronic kidney disease: Secondary | ICD-10-CM | POA: Diagnosis not present

## 2019-03-21 DIAGNOSIS — R519 Headache, unspecified: Secondary | ICD-10-CM | POA: Diagnosis not present

## 2019-03-21 DIAGNOSIS — R05 Cough: Secondary | ICD-10-CM | POA: Diagnosis not present

## 2019-03-21 DIAGNOSIS — R062 Wheezing: Secondary | ICD-10-CM | POA: Diagnosis not present

## 2019-04-16 DIAGNOSIS — S3210XD Unspecified fracture of sacrum, subsequent encounter for fracture with routine healing: Secondary | ICD-10-CM | POA: Diagnosis not present

## 2019-04-16 DIAGNOSIS — S32592D Other specified fracture of left pubis, subsequent encounter for fracture with routine healing: Secondary | ICD-10-CM | POA: Diagnosis not present

## 2019-04-22 DIAGNOSIS — I700 Atherosclerosis of aorta: Secondary | ICD-10-CM | POA: Diagnosis not present

## 2019-04-22 DIAGNOSIS — E1122 Type 2 diabetes mellitus with diabetic chronic kidney disease: Secondary | ICD-10-CM | POA: Diagnosis not present

## 2019-04-22 DIAGNOSIS — Z79899 Other long term (current) drug therapy: Secondary | ICD-10-CM | POA: Diagnosis not present

## 2019-04-30 DIAGNOSIS — M4727 Other spondylosis with radiculopathy, lumbosacral region: Secondary | ICD-10-CM | POA: Diagnosis not present

## 2019-05-28 DIAGNOSIS — R030 Elevated blood-pressure reading, without diagnosis of hypertension: Secondary | ICD-10-CM | POA: Diagnosis not present

## 2019-05-28 DIAGNOSIS — M48062 Spinal stenosis, lumbar region with neurogenic claudication: Secondary | ICD-10-CM | POA: Diagnosis not present

## 2019-05-28 DIAGNOSIS — Z6825 Body mass index (BMI) 25.0-25.9, adult: Secondary | ICD-10-CM | POA: Diagnosis not present

## 2019-05-28 DIAGNOSIS — M47816 Spondylosis without myelopathy or radiculopathy, lumbar region: Secondary | ICD-10-CM | POA: Diagnosis not present

## 2019-05-28 DIAGNOSIS — M461 Sacroiliitis, not elsewhere classified: Secondary | ICD-10-CM | POA: Diagnosis not present

## 2019-05-28 DIAGNOSIS — N183 Chronic kidney disease, stage 3 unspecified: Secondary | ICD-10-CM | POA: Diagnosis not present

## 2019-05-29 DIAGNOSIS — N1831 Chronic kidney disease, stage 3a: Secondary | ICD-10-CM | POA: Diagnosis not present

## 2019-06-07 DIAGNOSIS — M9080 Osteopathy in diseases classified elsewhere, unspecified site: Secondary | ICD-10-CM | POA: Diagnosis not present

## 2019-06-07 DIAGNOSIS — I129 Hypertensive chronic kidney disease with stage 1 through stage 4 chronic kidney disease, or unspecified chronic kidney disease: Secondary | ICD-10-CM | POA: Diagnosis not present

## 2019-06-07 DIAGNOSIS — E1122 Type 2 diabetes mellitus with diabetic chronic kidney disease: Secondary | ICD-10-CM | POA: Diagnosis not present

## 2019-06-07 DIAGNOSIS — E559 Vitamin D deficiency, unspecified: Secondary | ICD-10-CM | POA: Diagnosis not present

## 2019-06-07 DIAGNOSIS — E889 Metabolic disorder, unspecified: Secondary | ICD-10-CM | POA: Diagnosis not present

## 2019-06-07 DIAGNOSIS — N183 Chronic kidney disease, stage 3 unspecified: Secondary | ICD-10-CM | POA: Diagnosis not present

## 2019-06-12 DIAGNOSIS — M461 Sacroiliitis, not elsewhere classified: Secondary | ICD-10-CM | POA: Diagnosis not present

## 2019-06-18 DIAGNOSIS — L570 Actinic keratosis: Secondary | ICD-10-CM | POA: Diagnosis not present

## 2019-06-18 DIAGNOSIS — Z85820 Personal history of malignant melanoma of skin: Secondary | ICD-10-CM | POA: Diagnosis not present

## 2019-08-22 DIAGNOSIS — I700 Atherosclerosis of aorta: Secondary | ICD-10-CM | POA: Diagnosis not present

## 2019-08-22 DIAGNOSIS — Z1339 Encounter for screening examination for other mental health and behavioral disorders: Secondary | ICD-10-CM | POA: Diagnosis not present

## 2019-08-22 DIAGNOSIS — Z0000 Encounter for general adult medical examination without abnormal findings: Secondary | ICD-10-CM | POA: Diagnosis not present

## 2019-08-22 DIAGNOSIS — Z1331 Encounter for screening for depression: Secondary | ICD-10-CM | POA: Diagnosis not present

## 2019-08-22 DIAGNOSIS — N1832 Chronic kidney disease, stage 3b: Secondary | ICD-10-CM | POA: Diagnosis not present

## 2019-08-22 DIAGNOSIS — Z6828 Body mass index (BMI) 28.0-28.9, adult: Secondary | ICD-10-CM | POA: Diagnosis not present

## 2019-08-22 DIAGNOSIS — Z7189 Other specified counseling: Secondary | ICD-10-CM | POA: Diagnosis not present

## 2019-08-22 DIAGNOSIS — E1122 Type 2 diabetes mellitus with diabetic chronic kidney disease: Secondary | ICD-10-CM | POA: Diagnosis not present

## 2019-09-27 DIAGNOSIS — M461 Sacroiliitis, not elsewhere classified: Secondary | ICD-10-CM | POA: Diagnosis not present

## 2019-09-30 DIAGNOSIS — Z1231 Encounter for screening mammogram for malignant neoplasm of breast: Secondary | ICD-10-CM | POA: Diagnosis not present

## 2019-10-15 DIAGNOSIS — R21 Rash and other nonspecific skin eruption: Secondary | ICD-10-CM | POA: Diagnosis not present

## 2019-10-15 DIAGNOSIS — W57XXXA Bitten or stung by nonvenomous insect and other nonvenomous arthropods, initial encounter: Secondary | ICD-10-CM | POA: Diagnosis not present

## 2019-10-29 DIAGNOSIS — E039 Hypothyroidism, unspecified: Secondary | ICD-10-CM | POA: Diagnosis not present

## 2019-10-29 DIAGNOSIS — R209 Unspecified disturbances of skin sensation: Secondary | ICD-10-CM | POA: Diagnosis not present

## 2019-10-29 DIAGNOSIS — E1122 Type 2 diabetes mellitus with diabetic chronic kidney disease: Secondary | ICD-10-CM | POA: Diagnosis not present

## 2019-10-29 DIAGNOSIS — Z79899 Other long term (current) drug therapy: Secondary | ICD-10-CM | POA: Diagnosis not present

## 2019-10-31 DIAGNOSIS — Z85820 Personal history of malignant melanoma of skin: Secondary | ICD-10-CM | POA: Diagnosis not present

## 2019-10-31 DIAGNOSIS — L570 Actinic keratosis: Secondary | ICD-10-CM | POA: Diagnosis not present

## 2019-10-31 DIAGNOSIS — L578 Other skin changes due to chronic exposure to nonionizing radiation: Secondary | ICD-10-CM | POA: Diagnosis not present

## 2019-11-28 DIAGNOSIS — N183 Chronic kidney disease, stage 3 unspecified: Secondary | ICD-10-CM | POA: Diagnosis not present

## 2019-11-28 DIAGNOSIS — I129 Hypertensive chronic kidney disease with stage 1 through stage 4 chronic kidney disease, or unspecified chronic kidney disease: Secondary | ICD-10-CM | POA: Diagnosis not present

## 2019-12-05 DIAGNOSIS — N183 Chronic kidney disease, stage 3 unspecified: Secondary | ICD-10-CM | POA: Diagnosis not present

## 2019-12-05 DIAGNOSIS — I129 Hypertensive chronic kidney disease with stage 1 through stage 4 chronic kidney disease, or unspecified chronic kidney disease: Secondary | ICD-10-CM | POA: Diagnosis not present

## 2019-12-05 DIAGNOSIS — E559 Vitamin D deficiency, unspecified: Secondary | ICD-10-CM | POA: Diagnosis not present

## 2019-12-05 DIAGNOSIS — E889 Metabolic disorder, unspecified: Secondary | ICD-10-CM | POA: Diagnosis not present

## 2019-12-05 DIAGNOSIS — R809 Proteinuria, unspecified: Secondary | ICD-10-CM | POA: Insufficient documentation

## 2019-12-05 DIAGNOSIS — E1122 Type 2 diabetes mellitus with diabetic chronic kidney disease: Secondary | ICD-10-CM | POA: Diagnosis not present

## 2019-12-05 DIAGNOSIS — N1832 Chronic kidney disease, stage 3b: Secondary | ICD-10-CM | POA: Insufficient documentation

## 2019-12-05 DIAGNOSIS — M9080 Osteopathy in diseases classified elsewhere, unspecified site: Secondary | ICD-10-CM | POA: Diagnosis not present

## 2019-12-05 DIAGNOSIS — D631 Anemia in chronic kidney disease: Secondary | ICD-10-CM | POA: Diagnosis not present

## 2020-01-13 DIAGNOSIS — Z23 Encounter for immunization: Secondary | ICD-10-CM | POA: Diagnosis not present

## 2020-01-21 DIAGNOSIS — Z20828 Contact with and (suspected) exposure to other viral communicable diseases: Secondary | ICD-10-CM | POA: Diagnosis not present

## 2020-01-21 DIAGNOSIS — R05 Cough: Secondary | ICD-10-CM | POA: Diagnosis not present

## 2020-01-27 DIAGNOSIS — R519 Headache, unspecified: Secondary | ICD-10-CM | POA: Diagnosis not present

## 2020-01-27 DIAGNOSIS — R05 Cough: Secondary | ICD-10-CM | POA: Diagnosis not present

## 2020-01-31 DIAGNOSIS — H04123 Dry eye syndrome of bilateral lacrimal glands: Secondary | ICD-10-CM | POA: Diagnosis not present

## 2020-01-31 DIAGNOSIS — Z961 Presence of intraocular lens: Secondary | ICD-10-CM | POA: Diagnosis not present

## 2020-01-31 DIAGNOSIS — H40013 Open angle with borderline findings, low risk, bilateral: Secondary | ICD-10-CM | POA: Diagnosis not present

## 2020-01-31 DIAGNOSIS — E119 Type 2 diabetes mellitus without complications: Secondary | ICD-10-CM | POA: Diagnosis not present

## 2020-02-12 DIAGNOSIS — J309 Allergic rhinitis, unspecified: Secondary | ICD-10-CM | POA: Diagnosis not present

## 2020-02-12 DIAGNOSIS — R0981 Nasal congestion: Secondary | ICD-10-CM | POA: Diagnosis not present

## 2020-02-12 DIAGNOSIS — Z20828 Contact with and (suspected) exposure to other viral communicable diseases: Secondary | ICD-10-CM | POA: Diagnosis not present

## 2020-04-28 DIAGNOSIS — Z79899 Other long term (current) drug therapy: Secondary | ICD-10-CM | POA: Diagnosis not present

## 2020-04-28 DIAGNOSIS — E039 Hypothyroidism, unspecified: Secondary | ICD-10-CM | POA: Diagnosis not present

## 2020-04-28 DIAGNOSIS — K582 Mixed irritable bowel syndrome: Secondary | ICD-10-CM | POA: Diagnosis not present

## 2020-04-28 DIAGNOSIS — E1122 Type 2 diabetes mellitus with diabetic chronic kidney disease: Secondary | ICD-10-CM | POA: Diagnosis not present

## 2020-05-19 DIAGNOSIS — L570 Actinic keratosis: Secondary | ICD-10-CM | POA: Diagnosis not present

## 2020-05-19 DIAGNOSIS — L578 Other skin changes due to chronic exposure to nonionizing radiation: Secondary | ICD-10-CM | POA: Diagnosis not present

## 2020-05-19 DIAGNOSIS — Z85820 Personal history of malignant melanoma of skin: Secondary | ICD-10-CM | POA: Diagnosis not present

## 2020-05-19 DIAGNOSIS — L821 Other seborrheic keratosis: Secondary | ICD-10-CM | POA: Diagnosis not present

## 2020-05-29 DIAGNOSIS — I129 Hypertensive chronic kidney disease with stage 1 through stage 4 chronic kidney disease, or unspecified chronic kidney disease: Secondary | ICD-10-CM | POA: Diagnosis not present

## 2020-05-29 DIAGNOSIS — N183 Chronic kidney disease, stage 3 unspecified: Secondary | ICD-10-CM | POA: Diagnosis not present

## 2020-06-04 DIAGNOSIS — M9080 Osteopathy in diseases classified elsewhere, unspecified site: Secondary | ICD-10-CM | POA: Diagnosis not present

## 2020-06-04 DIAGNOSIS — N183 Chronic kidney disease, stage 3 unspecified: Secondary | ICD-10-CM | POA: Diagnosis not present

## 2020-06-04 DIAGNOSIS — E889 Metabolic disorder, unspecified: Secondary | ICD-10-CM | POA: Diagnosis not present

## 2020-06-04 DIAGNOSIS — E559 Vitamin D deficiency, unspecified: Secondary | ICD-10-CM | POA: Diagnosis not present

## 2020-06-04 DIAGNOSIS — I129 Hypertensive chronic kidney disease with stage 1 through stage 4 chronic kidney disease, or unspecified chronic kidney disease: Secondary | ICD-10-CM | POA: Diagnosis not present

## 2020-06-04 DIAGNOSIS — D631 Anemia in chronic kidney disease: Secondary | ICD-10-CM | POA: Diagnosis not present

## 2020-06-04 DIAGNOSIS — E1122 Type 2 diabetes mellitus with diabetic chronic kidney disease: Secondary | ICD-10-CM | POA: Diagnosis not present

## 2020-06-04 DIAGNOSIS — N1832 Chronic kidney disease, stage 3b: Secondary | ICD-10-CM | POA: Diagnosis not present

## 2020-07-20 DIAGNOSIS — K1121 Acute sialoadenitis: Secondary | ICD-10-CM | POA: Diagnosis not present

## 2020-07-20 DIAGNOSIS — N1832 Chronic kidney disease, stage 3b: Secondary | ICD-10-CM | POA: Diagnosis not present

## 2020-07-20 DIAGNOSIS — I700 Atherosclerosis of aorta: Secondary | ICD-10-CM | POA: Diagnosis not present

## 2020-07-20 DIAGNOSIS — Z6829 Body mass index (BMI) 29.0-29.9, adult: Secondary | ICD-10-CM | POA: Diagnosis not present

## 2020-07-20 DIAGNOSIS — E1122 Type 2 diabetes mellitus with diabetic chronic kidney disease: Secondary | ICD-10-CM | POA: Diagnosis not present

## 2020-07-20 DIAGNOSIS — Z1331 Encounter for screening for depression: Secondary | ICD-10-CM | POA: Diagnosis not present

## 2020-08-26 DIAGNOSIS — Z79899 Other long term (current) drug therapy: Secondary | ICD-10-CM | POA: Diagnosis not present

## 2020-08-26 DIAGNOSIS — E1122 Type 2 diabetes mellitus with diabetic chronic kidney disease: Secondary | ICD-10-CM | POA: Diagnosis not present

## 2020-08-27 DIAGNOSIS — Z1331 Encounter for screening for depression: Secondary | ICD-10-CM | POA: Diagnosis not present

## 2020-08-27 DIAGNOSIS — Z1339 Encounter for screening examination for other mental health and behavioral disorders: Secondary | ICD-10-CM | POA: Diagnosis not present

## 2020-08-27 DIAGNOSIS — E2839 Other primary ovarian failure: Secondary | ICD-10-CM | POA: Diagnosis not present

## 2020-08-27 DIAGNOSIS — Z0000 Encounter for general adult medical examination without abnormal findings: Secondary | ICD-10-CM | POA: Diagnosis not present

## 2020-08-27 DIAGNOSIS — Z1231 Encounter for screening mammogram for malignant neoplasm of breast: Secondary | ICD-10-CM | POA: Diagnosis not present

## 2020-08-27 DIAGNOSIS — Z6829 Body mass index (BMI) 29.0-29.9, adult: Secondary | ICD-10-CM | POA: Diagnosis not present

## 2020-10-16 DIAGNOSIS — Z1231 Encounter for screening mammogram for malignant neoplasm of breast: Secondary | ICD-10-CM | POA: Diagnosis not present

## 2020-10-16 DIAGNOSIS — E2839 Other primary ovarian failure: Secondary | ICD-10-CM | POA: Diagnosis not present

## 2020-10-16 DIAGNOSIS — M85851 Other specified disorders of bone density and structure, right thigh: Secondary | ICD-10-CM | POA: Diagnosis not present

## 2020-10-16 DIAGNOSIS — M85832 Other specified disorders of bone density and structure, left forearm: Secondary | ICD-10-CM | POA: Diagnosis not present

## 2020-10-26 DIAGNOSIS — E1122 Type 2 diabetes mellitus with diabetic chronic kidney disease: Secondary | ICD-10-CM | POA: Diagnosis not present

## 2020-10-26 DIAGNOSIS — E039 Hypothyroidism, unspecified: Secondary | ICD-10-CM | POA: Diagnosis not present

## 2020-10-26 DIAGNOSIS — N1832 Chronic kidney disease, stage 3b: Secondary | ICD-10-CM | POA: Diagnosis not present

## 2020-10-26 DIAGNOSIS — N2581 Secondary hyperparathyroidism of renal origin: Secondary | ICD-10-CM | POA: Diagnosis not present

## 2020-10-26 DIAGNOSIS — K582 Mixed irritable bowel syndrome: Secondary | ICD-10-CM | POA: Diagnosis not present

## 2020-10-27 DIAGNOSIS — L609 Nail disorder, unspecified: Secondary | ICD-10-CM | POA: Diagnosis not present

## 2020-11-10 DIAGNOSIS — Z13828 Encounter for screening for other musculoskeletal disorder: Secondary | ICD-10-CM | POA: Diagnosis not present

## 2020-11-10 DIAGNOSIS — Z471 Aftercare following joint replacement surgery: Secondary | ICD-10-CM | POA: Diagnosis not present

## 2020-11-10 DIAGNOSIS — B351 Tinea unguium: Secondary | ICD-10-CM | POA: Diagnosis not present

## 2020-11-10 DIAGNOSIS — M25461 Effusion, right knee: Secondary | ICD-10-CM | POA: Diagnosis not present

## 2020-11-10 DIAGNOSIS — M25462 Effusion, left knee: Secondary | ICD-10-CM | POA: Diagnosis not present

## 2020-11-10 DIAGNOSIS — Z96653 Presence of artificial knee joint, bilateral: Secondary | ICD-10-CM | POA: Diagnosis not present

## 2020-11-17 DIAGNOSIS — Z20822 Contact with and (suspected) exposure to covid-19: Secondary | ICD-10-CM | POA: Diagnosis not present

## 2020-11-24 DIAGNOSIS — N183 Chronic kidney disease, stage 3 unspecified: Secondary | ICD-10-CM | POA: Diagnosis not present

## 2020-11-24 DIAGNOSIS — E1122 Type 2 diabetes mellitus with diabetic chronic kidney disease: Secondary | ICD-10-CM | POA: Diagnosis not present

## 2020-11-30 DIAGNOSIS — Z85820 Personal history of malignant melanoma of skin: Secondary | ICD-10-CM | POA: Diagnosis not present

## 2020-11-30 DIAGNOSIS — L578 Other skin changes due to chronic exposure to nonionizing radiation: Secondary | ICD-10-CM | POA: Diagnosis not present

## 2020-11-30 DIAGNOSIS — L821 Other seborrheic keratosis: Secondary | ICD-10-CM | POA: Diagnosis not present

## 2020-11-30 DIAGNOSIS — B351 Tinea unguium: Secondary | ICD-10-CM | POA: Diagnosis not present

## 2020-11-30 DIAGNOSIS — L570 Actinic keratosis: Secondary | ICD-10-CM | POA: Diagnosis not present

## 2020-12-02 DIAGNOSIS — M9080 Osteopathy in diseases classified elsewhere, unspecified site: Secondary | ICD-10-CM | POA: Diagnosis not present

## 2020-12-02 DIAGNOSIS — E889 Metabolic disorder, unspecified: Secondary | ICD-10-CM | POA: Diagnosis not present

## 2020-12-02 DIAGNOSIS — E1122 Type 2 diabetes mellitus with diabetic chronic kidney disease: Secondary | ICD-10-CM | POA: Diagnosis not present

## 2020-12-02 DIAGNOSIS — D631 Anemia in chronic kidney disease: Secondary | ICD-10-CM | POA: Diagnosis not present

## 2020-12-02 DIAGNOSIS — N183 Chronic kidney disease, stage 3 unspecified: Secondary | ICD-10-CM | POA: Diagnosis not present

## 2020-12-02 DIAGNOSIS — E559 Vitamin D deficiency, unspecified: Secondary | ICD-10-CM | POA: Diagnosis not present

## 2020-12-02 DIAGNOSIS — N1832 Chronic kidney disease, stage 3b: Secondary | ICD-10-CM | POA: Diagnosis not present

## 2020-12-02 DIAGNOSIS — I129 Hypertensive chronic kidney disease with stage 1 through stage 4 chronic kidney disease, or unspecified chronic kidney disease: Secondary | ICD-10-CM | POA: Diagnosis not present

## 2021-01-18 DIAGNOSIS — Z20822 Contact with and (suspected) exposure to covid-19: Secondary | ICD-10-CM | POA: Diagnosis not present

## 2021-02-02 DIAGNOSIS — H35373 Puckering of macula, bilateral: Secondary | ICD-10-CM | POA: Diagnosis not present

## 2021-02-02 DIAGNOSIS — H04123 Dry eye syndrome of bilateral lacrimal glands: Secondary | ICD-10-CM | POA: Diagnosis not present

## 2021-02-02 DIAGNOSIS — H40013 Open angle with borderline findings, low risk, bilateral: Secondary | ICD-10-CM | POA: Diagnosis not present

## 2021-02-02 DIAGNOSIS — Z961 Presence of intraocular lens: Secondary | ICD-10-CM | POA: Diagnosis not present

## 2021-02-02 DIAGNOSIS — E119 Type 2 diabetes mellitus without complications: Secondary | ICD-10-CM | POA: Diagnosis not present

## 2021-02-23 DIAGNOSIS — Z23 Encounter for immunization: Secondary | ICD-10-CM | POA: Diagnosis not present

## 2021-03-26 DIAGNOSIS — Z23 Encounter for immunization: Secondary | ICD-10-CM | POA: Diagnosis not present

## 2021-03-31 DIAGNOSIS — M461 Sacroiliitis, not elsewhere classified: Secondary | ICD-10-CM | POA: Diagnosis not present

## 2021-05-02 DIAGNOSIS — R051 Acute cough: Secondary | ICD-10-CM | POA: Diagnosis not present

## 2021-05-02 DIAGNOSIS — Z20822 Contact with and (suspected) exposure to covid-19: Secondary | ICD-10-CM | POA: Diagnosis not present

## 2021-05-02 DIAGNOSIS — Z20828 Contact with and (suspected) exposure to other viral communicable diseases: Secondary | ICD-10-CM | POA: Diagnosis not present

## 2021-05-02 DIAGNOSIS — R0981 Nasal congestion: Secondary | ICD-10-CM | POA: Diagnosis not present

## 2021-05-02 DIAGNOSIS — J069 Acute upper respiratory infection, unspecified: Secondary | ICD-10-CM | POA: Diagnosis not present

## 2021-05-24 DIAGNOSIS — Z6828 Body mass index (BMI) 28.0-28.9, adult: Secondary | ICD-10-CM | POA: Diagnosis not present

## 2021-05-24 DIAGNOSIS — M1A9XX0 Chronic gout, unspecified, without tophus (tophi): Secondary | ICD-10-CM | POA: Diagnosis not present

## 2021-05-24 DIAGNOSIS — N2581 Secondary hyperparathyroidism of renal origin: Secondary | ICD-10-CM | POA: Diagnosis not present

## 2021-05-24 DIAGNOSIS — N1832 Chronic kidney disease, stage 3b: Secondary | ICD-10-CM | POA: Diagnosis not present

## 2021-05-24 DIAGNOSIS — Z79899 Other long term (current) drug therapy: Secondary | ICD-10-CM | POA: Diagnosis not present

## 2021-05-24 DIAGNOSIS — I700 Atherosclerosis of aorta: Secondary | ICD-10-CM | POA: Diagnosis not present

## 2021-05-24 DIAGNOSIS — E039 Hypothyroidism, unspecified: Secondary | ICD-10-CM | POA: Diagnosis not present

## 2021-05-24 DIAGNOSIS — E1122 Type 2 diabetes mellitus with diabetic chronic kidney disease: Secondary | ICD-10-CM | POA: Diagnosis not present

## 2021-06-01 DIAGNOSIS — Z85820 Personal history of malignant melanoma of skin: Secondary | ICD-10-CM | POA: Diagnosis not present

## 2021-06-01 DIAGNOSIS — N183 Chronic kidney disease, stage 3 unspecified: Secondary | ICD-10-CM | POA: Diagnosis not present

## 2021-06-01 DIAGNOSIS — L570 Actinic keratosis: Secondary | ICD-10-CM | POA: Diagnosis not present

## 2021-06-01 DIAGNOSIS — E1122 Type 2 diabetes mellitus with diabetic chronic kidney disease: Secondary | ICD-10-CM | POA: Diagnosis not present

## 2021-06-04 DIAGNOSIS — Z20822 Contact with and (suspected) exposure to covid-19: Secondary | ICD-10-CM | POA: Diagnosis not present

## 2021-06-04 DIAGNOSIS — N1832 Chronic kidney disease, stage 3b: Secondary | ICD-10-CM | POA: Diagnosis not present

## 2021-06-04 DIAGNOSIS — E1122 Type 2 diabetes mellitus with diabetic chronic kidney disease: Secondary | ICD-10-CM | POA: Diagnosis not present

## 2021-06-04 DIAGNOSIS — D631 Anemia in chronic kidney disease: Secondary | ICD-10-CM | POA: Diagnosis not present

## 2021-06-04 DIAGNOSIS — I129 Hypertensive chronic kidney disease with stage 1 through stage 4 chronic kidney disease, or unspecified chronic kidney disease: Secondary | ICD-10-CM | POA: Diagnosis not present

## 2021-06-17 DIAGNOSIS — M65322 Trigger finger, left index finger: Secondary | ICD-10-CM | POA: Diagnosis not present

## 2021-06-22 DIAGNOSIS — I2510 Atherosclerotic heart disease of native coronary artery without angina pectoris: Secondary | ICD-10-CM | POA: Diagnosis not present

## 2021-06-22 DIAGNOSIS — E213 Hyperparathyroidism, unspecified: Secondary | ICD-10-CM | POA: Diagnosis not present

## 2021-06-22 DIAGNOSIS — J439 Emphysema, unspecified: Secondary | ICD-10-CM | POA: Diagnosis not present

## 2021-06-22 DIAGNOSIS — J984 Other disorders of lung: Secondary | ICD-10-CM | POA: Diagnosis not present

## 2021-06-22 DIAGNOSIS — I700 Atherosclerosis of aorta: Secondary | ICD-10-CM | POA: Diagnosis not present

## 2021-06-22 DIAGNOSIS — E1122 Type 2 diabetes mellitus with diabetic chronic kidney disease: Secondary | ICD-10-CM | POA: Diagnosis not present

## 2021-07-08 DIAGNOSIS — Z822 Family history of deafness and hearing loss: Secondary | ICD-10-CM | POA: Diagnosis not present

## 2021-07-08 DIAGNOSIS — H9190 Unspecified hearing loss, unspecified ear: Secondary | ICD-10-CM | POA: Diagnosis not present

## 2021-07-08 DIAGNOSIS — H903 Sensorineural hearing loss, bilateral: Secondary | ICD-10-CM | POA: Diagnosis not present

## 2021-08-06 DIAGNOSIS — M65332 Trigger finger, left middle finger: Secondary | ICD-10-CM | POA: Diagnosis not present

## 2021-08-10 DIAGNOSIS — Z20822 Contact with and (suspected) exposure to covid-19: Secondary | ICD-10-CM | POA: Diagnosis not present

## 2021-08-30 DIAGNOSIS — Z1339 Encounter for screening examination for other mental health and behavioral disorders: Secondary | ICD-10-CM | POA: Diagnosis not present

## 2021-08-30 DIAGNOSIS — Z0001 Encounter for general adult medical examination with abnormal findings: Secondary | ICD-10-CM | POA: Diagnosis not present

## 2021-08-30 DIAGNOSIS — Z1331 Encounter for screening for depression: Secondary | ICD-10-CM | POA: Diagnosis not present

## 2021-08-30 DIAGNOSIS — E2839 Other primary ovarian failure: Secondary | ICD-10-CM | POA: Diagnosis not present

## 2021-08-30 DIAGNOSIS — Z1231 Encounter for screening mammogram for malignant neoplasm of breast: Secondary | ICD-10-CM | POA: Diagnosis not present

## 2021-08-30 DIAGNOSIS — Z6827 Body mass index (BMI) 27.0-27.9, adult: Secondary | ICD-10-CM | POA: Diagnosis not present

## 2021-09-09 DIAGNOSIS — Z20822 Contact with and (suspected) exposure to covid-19: Secondary | ICD-10-CM | POA: Diagnosis not present

## 2021-09-12 DIAGNOSIS — E1122 Type 2 diabetes mellitus with diabetic chronic kidney disease: Secondary | ICD-10-CM | POA: Diagnosis not present

## 2021-09-12 DIAGNOSIS — I700 Atherosclerosis of aorta: Secondary | ICD-10-CM | POA: Diagnosis not present

## 2021-10-19 DIAGNOSIS — Z1231 Encounter for screening mammogram for malignant neoplasm of breast: Secondary | ICD-10-CM | POA: Diagnosis not present

## 2021-11-29 DIAGNOSIS — N183 Chronic kidney disease, stage 3 unspecified: Secondary | ICD-10-CM | POA: Diagnosis not present

## 2021-11-29 DIAGNOSIS — I129 Hypertensive chronic kidney disease with stage 1 through stage 4 chronic kidney disease, or unspecified chronic kidney disease: Secondary | ICD-10-CM | POA: Diagnosis not present

## 2021-11-30 DIAGNOSIS — L501 Idiopathic urticaria: Secondary | ICD-10-CM | POA: Diagnosis not present

## 2021-11-30 DIAGNOSIS — L301 Dyshidrosis [pompholyx]: Secondary | ICD-10-CM | POA: Diagnosis not present

## 2021-11-30 DIAGNOSIS — Z85820 Personal history of malignant melanoma of skin: Secondary | ICD-10-CM | POA: Diagnosis not present

## 2021-12-02 DIAGNOSIS — I129 Hypertensive chronic kidney disease with stage 1 through stage 4 chronic kidney disease, or unspecified chronic kidney disease: Secondary | ICD-10-CM | POA: Diagnosis not present

## 2021-12-02 DIAGNOSIS — N1832 Chronic kidney disease, stage 3b: Secondary | ICD-10-CM | POA: Diagnosis not present

## 2021-12-02 DIAGNOSIS — N183 Chronic kidney disease, stage 3 unspecified: Secondary | ICD-10-CM | POA: Diagnosis not present

## 2021-12-02 DIAGNOSIS — E1122 Type 2 diabetes mellitus with diabetic chronic kidney disease: Secondary | ICD-10-CM | POA: Diagnosis not present

## 2021-12-02 DIAGNOSIS — M898X9 Other specified disorders of bone, unspecified site: Secondary | ICD-10-CM | POA: Diagnosis not present

## 2021-12-02 DIAGNOSIS — R809 Proteinuria, unspecified: Secondary | ICD-10-CM | POA: Diagnosis not present

## 2021-12-02 DIAGNOSIS — D631 Anemia in chronic kidney disease: Secondary | ICD-10-CM | POA: Diagnosis not present

## 2021-12-02 DIAGNOSIS — E559 Vitamin D deficiency, unspecified: Secondary | ICD-10-CM | POA: Diagnosis not present

## 2021-12-06 DIAGNOSIS — M1811 Unilateral primary osteoarthritis of first carpometacarpal joint, right hand: Secondary | ICD-10-CM | POA: Diagnosis not present

## 2022-01-28 DIAGNOSIS — N3091 Cystitis, unspecified with hematuria: Secondary | ICD-10-CM | POA: Diagnosis not present

## 2022-01-30 DIAGNOSIS — R21 Rash and other nonspecific skin eruption: Secondary | ICD-10-CM | POA: Diagnosis not present

## 2022-01-30 DIAGNOSIS — T50995A Adverse effect of other drugs, medicaments and biological substances, initial encounter: Secondary | ICD-10-CM | POA: Diagnosis not present

## 2022-01-30 DIAGNOSIS — L299 Pruritus, unspecified: Secondary | ICD-10-CM | POA: Diagnosis not present

## 2022-02-08 DIAGNOSIS — H04123 Dry eye syndrome of bilateral lacrimal glands: Secondary | ICD-10-CM | POA: Diagnosis not present

## 2022-02-08 DIAGNOSIS — E119 Type 2 diabetes mellitus without complications: Secondary | ICD-10-CM | POA: Diagnosis not present

## 2022-02-08 DIAGNOSIS — H40013 Open angle with borderline findings, low risk, bilateral: Secondary | ICD-10-CM | POA: Diagnosis not present

## 2022-02-08 DIAGNOSIS — Z961 Presence of intraocular lens: Secondary | ICD-10-CM | POA: Diagnosis not present

## 2022-02-16 DIAGNOSIS — Z23 Encounter for immunization: Secondary | ICD-10-CM | POA: Diagnosis not present

## 2022-02-24 DIAGNOSIS — R051 Acute cough: Secondary | ICD-10-CM | POA: Diagnosis not present

## 2022-02-24 DIAGNOSIS — J069 Acute upper respiratory infection, unspecified: Secondary | ICD-10-CM | POA: Diagnosis not present

## 2022-02-24 DIAGNOSIS — R0981 Nasal congestion: Secondary | ICD-10-CM | POA: Diagnosis not present

## 2022-03-15 DIAGNOSIS — Z96653 Presence of artificial knee joint, bilateral: Secondary | ICD-10-CM | POA: Diagnosis not present

## 2022-03-15 DIAGNOSIS — M25462 Effusion, left knee: Secondary | ICD-10-CM | POA: Diagnosis not present

## 2022-03-15 DIAGNOSIS — M25461 Effusion, right knee: Secondary | ICD-10-CM | POA: Diagnosis not present

## 2022-03-23 DIAGNOSIS — E039 Hypothyroidism, unspecified: Secondary | ICD-10-CM | POA: Diagnosis not present

## 2022-03-23 DIAGNOSIS — Z79899 Other long term (current) drug therapy: Secondary | ICD-10-CM | POA: Diagnosis not present

## 2022-03-23 DIAGNOSIS — M1A9XX0 Chronic gout, unspecified, without tophus (tophi): Secondary | ICD-10-CM | POA: Diagnosis not present

## 2022-03-23 DIAGNOSIS — E1122 Type 2 diabetes mellitus with diabetic chronic kidney disease: Secondary | ICD-10-CM | POA: Diagnosis not present

## 2022-03-23 DIAGNOSIS — N2581 Secondary hyperparathyroidism of renal origin: Secondary | ICD-10-CM | POA: Diagnosis not present

## 2022-03-24 DIAGNOSIS — M1A9XX0 Chronic gout, unspecified, without tophus (tophi): Secondary | ICD-10-CM | POA: Diagnosis not present

## 2022-03-24 DIAGNOSIS — N1832 Chronic kidney disease, stage 3b: Secondary | ICD-10-CM | POA: Diagnosis not present

## 2022-03-24 DIAGNOSIS — I700 Atherosclerosis of aorta: Secondary | ICD-10-CM | POA: Diagnosis not present

## 2022-03-24 DIAGNOSIS — E039 Hypothyroidism, unspecified: Secondary | ICD-10-CM | POA: Diagnosis not present

## 2022-03-24 DIAGNOSIS — Z6827 Body mass index (BMI) 27.0-27.9, adult: Secondary | ICD-10-CM | POA: Diagnosis not present

## 2022-03-24 DIAGNOSIS — E785 Hyperlipidemia, unspecified: Secondary | ICD-10-CM | POA: Diagnosis not present

## 2022-03-24 DIAGNOSIS — E1122 Type 2 diabetes mellitus with diabetic chronic kidney disease: Secondary | ICD-10-CM | POA: Diagnosis not present

## 2022-05-10 DIAGNOSIS — Z23 Encounter for immunization: Secondary | ICD-10-CM | POA: Diagnosis not present

## 2022-05-23 DIAGNOSIS — L300 Nummular dermatitis: Secondary | ICD-10-CM | POA: Diagnosis not present

## 2022-05-23 DIAGNOSIS — L501 Idiopathic urticaria: Secondary | ICD-10-CM | POA: Diagnosis not present

## 2022-06-07 DIAGNOSIS — L821 Other seborrheic keratosis: Secondary | ICD-10-CM | POA: Diagnosis not present

## 2022-06-07 DIAGNOSIS — B351 Tinea unguium: Secondary | ICD-10-CM | POA: Diagnosis not present

## 2022-06-07 DIAGNOSIS — L570 Actinic keratosis: Secondary | ICD-10-CM | POA: Diagnosis not present

## 2022-06-07 DIAGNOSIS — Z85820 Personal history of malignant melanoma of skin: Secondary | ICD-10-CM | POA: Diagnosis not present

## 2022-06-08 DIAGNOSIS — N183 Chronic kidney disease, stage 3 unspecified: Secondary | ICD-10-CM | POA: Diagnosis not present

## 2022-06-08 DIAGNOSIS — E1122 Type 2 diabetes mellitus with diabetic chronic kidney disease: Secondary | ICD-10-CM | POA: Diagnosis not present

## 2022-06-08 DIAGNOSIS — I129 Hypertensive chronic kidney disease with stage 1 through stage 4 chronic kidney disease, or unspecified chronic kidney disease: Secondary | ICD-10-CM | POA: Diagnosis not present

## 2022-06-09 DIAGNOSIS — M47812 Spondylosis without myelopathy or radiculopathy, cervical region: Secondary | ICD-10-CM | POA: Diagnosis not present

## 2022-06-09 DIAGNOSIS — R42 Dizziness and giddiness: Secondary | ICD-10-CM | POA: Diagnosis not present

## 2022-06-09 DIAGNOSIS — S0093XA Contusion of unspecified part of head, initial encounter: Secondary | ICD-10-CM | POA: Diagnosis not present

## 2022-06-09 DIAGNOSIS — S300XXA Contusion of lower back and pelvis, initial encounter: Secondary | ICD-10-CM | POA: Diagnosis not present

## 2022-06-09 DIAGNOSIS — I6529 Occlusion and stenosis of unspecified carotid artery: Secondary | ICD-10-CM | POA: Diagnosis not present

## 2022-06-09 DIAGNOSIS — G319 Degenerative disease of nervous system, unspecified: Secondary | ICD-10-CM | POA: Diagnosis not present

## 2022-06-09 DIAGNOSIS — W19XXXA Unspecified fall, initial encounter: Secondary | ICD-10-CM | POA: Diagnosis not present

## 2022-06-14 DIAGNOSIS — N1832 Chronic kidney disease, stage 3b: Secondary | ICD-10-CM | POA: Diagnosis not present

## 2022-06-14 DIAGNOSIS — R42 Dizziness and giddiness: Secondary | ICD-10-CM | POA: Diagnosis not present

## 2022-06-14 DIAGNOSIS — Z6827 Body mass index (BMI) 27.0-27.9, adult: Secondary | ICD-10-CM | POA: Diagnosis not present

## 2022-06-14 DIAGNOSIS — N183 Chronic kidney disease, stage 3 unspecified: Secondary | ICD-10-CM | POA: Diagnosis not present

## 2022-06-14 DIAGNOSIS — M898X9 Other specified disorders of bone, unspecified site: Secondary | ICD-10-CM | POA: Diagnosis not present

## 2022-06-14 DIAGNOSIS — D631 Anemia in chronic kidney disease: Secondary | ICD-10-CM | POA: Diagnosis not present

## 2022-06-14 DIAGNOSIS — I129 Hypertensive chronic kidney disease with stage 1 through stage 4 chronic kidney disease, or unspecified chronic kidney disease: Secondary | ICD-10-CM | POA: Diagnosis not present

## 2022-06-14 DIAGNOSIS — E559 Vitamin D deficiency, unspecified: Secondary | ICD-10-CM | POA: Diagnosis not present

## 2022-06-14 DIAGNOSIS — E1122 Type 2 diabetes mellitus with diabetic chronic kidney disease: Secondary | ICD-10-CM | POA: Diagnosis not present

## 2022-06-20 DIAGNOSIS — E119 Type 2 diabetes mellitus without complications: Secondary | ICD-10-CM | POA: Diagnosis not present

## 2022-06-20 DIAGNOSIS — E785 Hyperlipidemia, unspecified: Secondary | ICD-10-CM | POA: Diagnosis not present

## 2022-06-20 DIAGNOSIS — I6521 Occlusion and stenosis of right carotid artery: Secondary | ICD-10-CM | POA: Diagnosis not present

## 2022-06-20 DIAGNOSIS — R42 Dizziness and giddiness: Secondary | ICD-10-CM | POA: Diagnosis not present

## 2022-09-08 DIAGNOSIS — Z79899 Other long term (current) drug therapy: Secondary | ICD-10-CM | POA: Diagnosis not present

## 2022-09-08 DIAGNOSIS — E785 Hyperlipidemia, unspecified: Secondary | ICD-10-CM | POA: Diagnosis not present

## 2022-09-08 DIAGNOSIS — N1832 Chronic kidney disease, stage 3b: Secondary | ICD-10-CM | POA: Diagnosis not present

## 2022-09-08 DIAGNOSIS — E1122 Type 2 diabetes mellitus with diabetic chronic kidney disease: Secondary | ICD-10-CM | POA: Diagnosis not present

## 2022-09-08 DIAGNOSIS — E039 Hypothyroidism, unspecified: Secondary | ICD-10-CM | POA: Diagnosis not present

## 2022-09-08 DIAGNOSIS — M1A9XX0 Chronic gout, unspecified, without tophus (tophi): Secondary | ICD-10-CM | POA: Diagnosis not present

## 2022-09-13 DIAGNOSIS — E785 Hyperlipidemia, unspecified: Secondary | ICD-10-CM | POA: Diagnosis not present

## 2022-09-13 DIAGNOSIS — Z6827 Body mass index (BMI) 27.0-27.9, adult: Secondary | ICD-10-CM | POA: Diagnosis not present

## 2022-09-13 DIAGNOSIS — E1122 Type 2 diabetes mellitus with diabetic chronic kidney disease: Secondary | ICD-10-CM | POA: Diagnosis not present

## 2022-09-13 DIAGNOSIS — E039 Hypothyroidism, unspecified: Secondary | ICD-10-CM | POA: Diagnosis not present

## 2022-09-13 DIAGNOSIS — M1A9XX0 Chronic gout, unspecified, without tophus (tophi): Secondary | ICD-10-CM | POA: Diagnosis not present

## 2022-09-13 DIAGNOSIS — Z0000 Encounter for general adult medical examination without abnormal findings: Secondary | ICD-10-CM | POA: Diagnosis not present

## 2022-09-13 DIAGNOSIS — I700 Atherosclerosis of aorta: Secondary | ICD-10-CM | POA: Diagnosis not present

## 2022-09-13 DIAGNOSIS — N1832 Chronic kidney disease, stage 3b: Secondary | ICD-10-CM | POA: Diagnosis not present

## 2022-09-13 DIAGNOSIS — N2581 Secondary hyperparathyroidism of renal origin: Secondary | ICD-10-CM | POA: Diagnosis not present

## 2022-10-27 DIAGNOSIS — Z1231 Encounter for screening mammogram for malignant neoplasm of breast: Secondary | ICD-10-CM | POA: Diagnosis not present

## 2022-11-10 DIAGNOSIS — B351 Tinea unguium: Secondary | ICD-10-CM | POA: Diagnosis not present

## 2022-11-10 DIAGNOSIS — L209 Atopic dermatitis, unspecified: Secondary | ICD-10-CM | POA: Diagnosis not present

## 2022-11-10 DIAGNOSIS — B86 Scabies: Secondary | ICD-10-CM | POA: Diagnosis not present

## 2022-11-10 DIAGNOSIS — L501 Idiopathic urticaria: Secondary | ICD-10-CM | POA: Diagnosis not present

## 2022-12-06 DIAGNOSIS — L570 Actinic keratosis: Secondary | ICD-10-CM | POA: Diagnosis not present

## 2022-12-06 DIAGNOSIS — B86 Scabies: Secondary | ICD-10-CM | POA: Diagnosis not present

## 2022-12-06 DIAGNOSIS — Z85820 Personal history of malignant melanoma of skin: Secondary | ICD-10-CM | POA: Diagnosis not present

## 2022-12-06 DIAGNOSIS — I129 Hypertensive chronic kidney disease with stage 1 through stage 4 chronic kidney disease, or unspecified chronic kidney disease: Secondary | ICD-10-CM | POA: Diagnosis not present

## 2022-12-06 DIAGNOSIS — L578 Other skin changes due to chronic exposure to nonionizing radiation: Secondary | ICD-10-CM | POA: Diagnosis not present

## 2022-12-06 DIAGNOSIS — L821 Other seborrheic keratosis: Secondary | ICD-10-CM | POA: Diagnosis not present

## 2022-12-13 DIAGNOSIS — N1832 Chronic kidney disease, stage 3b: Secondary | ICD-10-CM | POA: Diagnosis not present

## 2022-12-13 DIAGNOSIS — R809 Proteinuria, unspecified: Secondary | ICD-10-CM | POA: Diagnosis not present

## 2022-12-13 DIAGNOSIS — M898X9 Other specified disorders of bone, unspecified site: Secondary | ICD-10-CM | POA: Diagnosis not present

## 2022-12-13 DIAGNOSIS — E559 Vitamin D deficiency, unspecified: Secondary | ICD-10-CM | POA: Diagnosis not present

## 2022-12-13 DIAGNOSIS — I129 Hypertensive chronic kidney disease with stage 1 through stage 4 chronic kidney disease, or unspecified chronic kidney disease: Secondary | ICD-10-CM | POA: Diagnosis not present

## 2022-12-13 DIAGNOSIS — D631 Anemia in chronic kidney disease: Secondary | ICD-10-CM | POA: Diagnosis not present

## 2022-12-13 DIAGNOSIS — E1122 Type 2 diabetes mellitus with diabetic chronic kidney disease: Secondary | ICD-10-CM | POA: Diagnosis not present

## 2023-01-09 DIAGNOSIS — B86 Scabies: Secondary | ICD-10-CM | POA: Diagnosis not present

## 2023-01-10 DIAGNOSIS — Z6825 Body mass index (BMI) 25.0-25.9, adult: Secondary | ICD-10-CM | POA: Diagnosis not present

## 2023-01-10 DIAGNOSIS — N1832 Chronic kidney disease, stage 3b: Secondary | ICD-10-CM | POA: Diagnosis not present

## 2023-01-10 DIAGNOSIS — E039 Hypothyroidism, unspecified: Secondary | ICD-10-CM | POA: Diagnosis not present

## 2023-01-10 DIAGNOSIS — L309 Dermatitis, unspecified: Secondary | ICD-10-CM | POA: Diagnosis not present

## 2023-01-10 DIAGNOSIS — E2839 Other primary ovarian failure: Secondary | ICD-10-CM | POA: Diagnosis not present

## 2023-01-10 DIAGNOSIS — M1A9XX0 Chronic gout, unspecified, without tophus (tophi): Secondary | ICD-10-CM | POA: Diagnosis not present

## 2023-01-10 DIAGNOSIS — E1122 Type 2 diabetes mellitus with diabetic chronic kidney disease: Secondary | ICD-10-CM | POA: Diagnosis not present

## 2023-01-19 DIAGNOSIS — Z6826 Body mass index (BMI) 26.0-26.9, adult: Secondary | ICD-10-CM | POA: Diagnosis not present

## 2023-01-19 DIAGNOSIS — R399 Unspecified symptoms and signs involving the genitourinary system: Secondary | ICD-10-CM | POA: Diagnosis not present

## 2023-01-26 DIAGNOSIS — E2839 Other primary ovarian failure: Secondary | ICD-10-CM | POA: Diagnosis not present

## 2023-01-26 DIAGNOSIS — M8589 Other specified disorders of bone density and structure, multiple sites: Secondary | ICD-10-CM | POA: Diagnosis not present

## 2023-02-18 DIAGNOSIS — Z23 Encounter for immunization: Secondary | ICD-10-CM | POA: Diagnosis not present

## 2023-03-22 DIAGNOSIS — L209 Atopic dermatitis, unspecified: Secondary | ICD-10-CM | POA: Diagnosis not present

## 2023-03-30 DIAGNOSIS — M4317 Spondylolisthesis, lumbosacral region: Secondary | ICD-10-CM | POA: Diagnosis not present

## 2023-04-03 DIAGNOSIS — E785 Hyperlipidemia, unspecified: Secondary | ICD-10-CM | POA: Diagnosis not present

## 2023-04-03 DIAGNOSIS — M549 Dorsalgia, unspecified: Secondary | ICD-10-CM | POA: Diagnosis not present

## 2023-04-03 DIAGNOSIS — M1A9XX0 Chronic gout, unspecified, without tophus (tophi): Secondary | ICD-10-CM | POA: Diagnosis not present

## 2023-04-03 DIAGNOSIS — Z6826 Body mass index (BMI) 26.0-26.9, adult: Secondary | ICD-10-CM | POA: Diagnosis not present

## 2023-04-03 DIAGNOSIS — N1832 Chronic kidney disease, stage 3b: Secondary | ICD-10-CM | POA: Diagnosis not present

## 2023-04-03 DIAGNOSIS — E039 Hypothyroidism, unspecified: Secondary | ICD-10-CM | POA: Diagnosis not present

## 2023-04-03 DIAGNOSIS — Z79899 Other long term (current) drug therapy: Secondary | ICD-10-CM | POA: Diagnosis not present

## 2023-04-03 DIAGNOSIS — E1122 Type 2 diabetes mellitus with diabetic chronic kidney disease: Secondary | ICD-10-CM | POA: Diagnosis not present

## 2023-05-06 DIAGNOSIS — N3091 Cystitis, unspecified with hematuria: Secondary | ICD-10-CM | POA: Diagnosis not present

## 2023-05-06 DIAGNOSIS — R1030 Lower abdominal pain, unspecified: Secondary | ICD-10-CM | POA: Diagnosis not present

## 2023-05-06 DIAGNOSIS — R300 Dysuria: Secondary | ICD-10-CM | POA: Diagnosis not present

## 2023-05-09 DIAGNOSIS — N952 Postmenopausal atrophic vaginitis: Secondary | ICD-10-CM | POA: Diagnosis not present

## 2023-05-09 DIAGNOSIS — Z6825 Body mass index (BMI) 25.0-25.9, adult: Secondary | ICD-10-CM | POA: Diagnosis not present

## 2023-05-09 DIAGNOSIS — R300 Dysuria: Secondary | ICD-10-CM | POA: Diagnosis not present

## 2023-05-23 DIAGNOSIS — H04123 Dry eye syndrome of bilateral lacrimal glands: Secondary | ICD-10-CM | POA: Diagnosis not present

## 2023-05-23 DIAGNOSIS — H40013 Open angle with borderline findings, low risk, bilateral: Secondary | ICD-10-CM | POA: Diagnosis not present

## 2023-05-23 DIAGNOSIS — E119 Type 2 diabetes mellitus without complications: Secondary | ICD-10-CM | POA: Diagnosis not present

## 2023-05-30 DIAGNOSIS — M47816 Spondylosis without myelopathy or radiculopathy, lumbar region: Secondary | ICD-10-CM | POA: Diagnosis not present

## 2023-06-09 DIAGNOSIS — N183 Chronic kidney disease, stage 3 unspecified: Secondary | ICD-10-CM | POA: Diagnosis not present

## 2023-06-09 DIAGNOSIS — E1122 Type 2 diabetes mellitus with diabetic chronic kidney disease: Secondary | ICD-10-CM | POA: Diagnosis not present

## 2023-06-15 DIAGNOSIS — E559 Vitamin D deficiency, unspecified: Secondary | ICD-10-CM | POA: Diagnosis not present

## 2023-06-15 DIAGNOSIS — E1122 Type 2 diabetes mellitus with diabetic chronic kidney disease: Secondary | ICD-10-CM | POA: Diagnosis not present

## 2023-06-15 DIAGNOSIS — D631 Anemia in chronic kidney disease: Secondary | ICD-10-CM | POA: Diagnosis not present

## 2023-06-15 DIAGNOSIS — M898X9 Other specified disorders of bone, unspecified site: Secondary | ICD-10-CM | POA: Diagnosis not present

## 2023-06-15 DIAGNOSIS — I129 Hypertensive chronic kidney disease with stage 1 through stage 4 chronic kidney disease, or unspecified chronic kidney disease: Secondary | ICD-10-CM | POA: Diagnosis not present

## 2023-06-15 DIAGNOSIS — N1832 Chronic kidney disease, stage 3b: Secondary | ICD-10-CM | POA: Diagnosis not present

## 2023-06-15 DIAGNOSIS — R8281 Pyuria: Secondary | ICD-10-CM | POA: Diagnosis not present

## 2023-06-26 DIAGNOSIS — E785 Hyperlipidemia, unspecified: Secondary | ICD-10-CM | POA: Diagnosis not present

## 2023-06-26 DIAGNOSIS — E1122 Type 2 diabetes mellitus with diabetic chronic kidney disease: Secondary | ICD-10-CM | POA: Diagnosis not present

## 2023-06-26 DIAGNOSIS — Z79899 Other long term (current) drug therapy: Secondary | ICD-10-CM | POA: Diagnosis not present

## 2023-07-03 DIAGNOSIS — M1811 Unilateral primary osteoarthritis of first carpometacarpal joint, right hand: Secondary | ICD-10-CM | POA: Diagnosis not present

## 2023-07-03 DIAGNOSIS — G5603 Carpal tunnel syndrome, bilateral upper limbs: Secondary | ICD-10-CM | POA: Diagnosis not present

## 2023-07-04 DIAGNOSIS — L601 Onycholysis: Secondary | ICD-10-CM | POA: Diagnosis not present

## 2023-07-14 DIAGNOSIS — E785 Hyperlipidemia, unspecified: Secondary | ICD-10-CM | POA: Diagnosis not present

## 2023-07-14 DIAGNOSIS — M8580 Other specified disorders of bone density and structure, unspecified site: Secondary | ICD-10-CM | POA: Diagnosis not present

## 2023-07-14 DIAGNOSIS — Z8673 Personal history of transient ischemic attack (TIA), and cerebral infarction without residual deficits: Secondary | ICD-10-CM | POA: Diagnosis not present

## 2023-07-14 DIAGNOSIS — N184 Chronic kidney disease, stage 4 (severe): Secondary | ICD-10-CM | POA: Diagnosis not present

## 2023-07-14 DIAGNOSIS — Z86718 Personal history of other venous thrombosis and embolism: Secondary | ICD-10-CM | POA: Diagnosis not present

## 2023-07-14 DIAGNOSIS — Z79899 Other long term (current) drug therapy: Secondary | ICD-10-CM | POA: Diagnosis not present

## 2023-07-14 DIAGNOSIS — G5601 Carpal tunnel syndrome, right upper limb: Secondary | ICD-10-CM | POA: Diagnosis not present

## 2023-07-14 DIAGNOSIS — E559 Vitamin D deficiency, unspecified: Secondary | ICD-10-CM | POA: Diagnosis not present

## 2023-07-14 DIAGNOSIS — M810 Age-related osteoporosis without current pathological fracture: Secondary | ICD-10-CM | POA: Diagnosis not present

## 2023-07-14 DIAGNOSIS — I129 Hypertensive chronic kidney disease with stage 1 through stage 4 chronic kidney disease, or unspecified chronic kidney disease: Secondary | ICD-10-CM | POA: Diagnosis not present

## 2023-07-14 DIAGNOSIS — M109 Gout, unspecified: Secondary | ICD-10-CM | POA: Diagnosis not present

## 2023-07-14 DIAGNOSIS — I10 Essential (primary) hypertension: Secondary | ICD-10-CM | POA: Diagnosis not present

## 2023-07-14 DIAGNOSIS — E1122 Type 2 diabetes mellitus with diabetic chronic kidney disease: Secondary | ICD-10-CM | POA: Diagnosis not present

## 2023-07-14 DIAGNOSIS — E039 Hypothyroidism, unspecified: Secondary | ICD-10-CM | POA: Diagnosis not present

## 2023-08-07 DIAGNOSIS — Z6826 Body mass index (BMI) 26.0-26.9, adult: Secondary | ICD-10-CM | POA: Diagnosis not present

## 2023-08-07 DIAGNOSIS — J329 Chronic sinusitis, unspecified: Secondary | ICD-10-CM | POA: Diagnosis not present

## 2023-08-07 DIAGNOSIS — J40 Bronchitis, not specified as acute or chronic: Secondary | ICD-10-CM | POA: Diagnosis not present

## 2023-08-09 DIAGNOSIS — Z79899 Other long term (current) drug therapy: Secondary | ICD-10-CM | POA: Diagnosis not present

## 2023-08-09 DIAGNOSIS — R42 Dizziness and giddiness: Secondary | ICD-10-CM | POA: Diagnosis not present

## 2023-08-14 DIAGNOSIS — J40 Bronchitis, not specified as acute or chronic: Secondary | ICD-10-CM | POA: Diagnosis not present

## 2023-08-14 DIAGNOSIS — Z6826 Body mass index (BMI) 26.0-26.9, adult: Secondary | ICD-10-CM | POA: Diagnosis not present

## 2023-08-14 DIAGNOSIS — R0982 Postnasal drip: Secondary | ICD-10-CM | POA: Diagnosis not present

## 2023-09-19 DIAGNOSIS — N1832 Chronic kidney disease, stage 3b: Secondary | ICD-10-CM | POA: Diagnosis not present

## 2023-09-19 DIAGNOSIS — Z1331 Encounter for screening for depression: Secondary | ICD-10-CM | POA: Diagnosis not present

## 2023-09-19 DIAGNOSIS — M1A9XX0 Chronic gout, unspecified, without tophus (tophi): Secondary | ICD-10-CM | POA: Diagnosis not present

## 2023-09-19 DIAGNOSIS — E785 Hyperlipidemia, unspecified: Secondary | ICD-10-CM | POA: Diagnosis not present

## 2023-09-19 DIAGNOSIS — Z0000 Encounter for general adult medical examination without abnormal findings: Secondary | ICD-10-CM | POA: Diagnosis not present

## 2023-09-19 DIAGNOSIS — E1122 Type 2 diabetes mellitus with diabetic chronic kidney disease: Secondary | ICD-10-CM | POA: Diagnosis not present

## 2023-09-19 DIAGNOSIS — N3941 Urge incontinence: Secondary | ICD-10-CM | POA: Diagnosis not present

## 2023-09-19 DIAGNOSIS — E039 Hypothyroidism, unspecified: Secondary | ICD-10-CM | POA: Diagnosis not present

## 2023-09-19 DIAGNOSIS — Z6825 Body mass index (BMI) 25.0-25.9, adult: Secondary | ICD-10-CM | POA: Diagnosis not present

## 2023-09-19 DIAGNOSIS — Z79899 Other long term (current) drug therapy: Secondary | ICD-10-CM | POA: Diagnosis not present

## 2023-12-07 DIAGNOSIS — E1122 Type 2 diabetes mellitus with diabetic chronic kidney disease: Secondary | ICD-10-CM | POA: Diagnosis not present

## 2023-12-07 DIAGNOSIS — N183 Chronic kidney disease, stage 3 unspecified: Secondary | ICD-10-CM | POA: Diagnosis not present

## 2023-12-07 DIAGNOSIS — I129 Hypertensive chronic kidney disease with stage 1 through stage 4 chronic kidney disease, or unspecified chronic kidney disease: Secondary | ICD-10-CM | POA: Diagnosis not present

## 2023-12-11 DIAGNOSIS — L570 Actinic keratosis: Secondary | ICD-10-CM | POA: Diagnosis not present

## 2023-12-11 DIAGNOSIS — L578 Other skin changes due to chronic exposure to nonionizing radiation: Secondary | ICD-10-CM | POA: Diagnosis not present

## 2023-12-11 DIAGNOSIS — Z85820 Personal history of malignant melanoma of skin: Secondary | ICD-10-CM | POA: Diagnosis not present

## 2023-12-11 DIAGNOSIS — L219 Seborrheic dermatitis, unspecified: Secondary | ICD-10-CM | POA: Diagnosis not present

## 2023-12-11 DIAGNOSIS — L821 Other seborrheic keratosis: Secondary | ICD-10-CM | POA: Diagnosis not present

## 2023-12-12 DIAGNOSIS — Z1231 Encounter for screening mammogram for malignant neoplasm of breast: Secondary | ICD-10-CM | POA: Diagnosis not present

## 2023-12-13 DIAGNOSIS — M898X9 Other specified disorders of bone, unspecified site: Secondary | ICD-10-CM | POA: Diagnosis not present

## 2023-12-13 DIAGNOSIS — R7989 Other specified abnormal findings of blood chemistry: Secondary | ICD-10-CM | POA: Diagnosis not present

## 2023-12-13 DIAGNOSIS — Z1231 Encounter for screening mammogram for malignant neoplasm of breast: Secondary | ICD-10-CM | POA: Diagnosis not present

## 2023-12-13 DIAGNOSIS — I129 Hypertensive chronic kidney disease with stage 1 through stage 4 chronic kidney disease, or unspecified chronic kidney disease: Secondary | ICD-10-CM | POA: Diagnosis not present

## 2023-12-13 DIAGNOSIS — D631 Anemia in chronic kidney disease: Secondary | ICD-10-CM | POA: Diagnosis not present

## 2023-12-13 DIAGNOSIS — E559 Vitamin D deficiency, unspecified: Secondary | ICD-10-CM | POA: Diagnosis not present

## 2023-12-13 DIAGNOSIS — N1832 Chronic kidney disease, stage 3b: Secondary | ICD-10-CM | POA: Diagnosis not present

## 2024-01-22 DIAGNOSIS — N390 Urinary tract infection, site not specified: Secondary | ICD-10-CM | POA: Diagnosis not present

## 2024-02-24 DIAGNOSIS — R051 Acute cough: Secondary | ICD-10-CM | POA: Diagnosis not present

## 2024-02-24 DIAGNOSIS — R0981 Nasal congestion: Secondary | ICD-10-CM | POA: Diagnosis not present

## 2024-02-24 DIAGNOSIS — R070 Pain in throat: Secondary | ICD-10-CM | POA: Diagnosis not present

## 2024-03-12 DIAGNOSIS — G5603 Carpal tunnel syndrome, bilateral upper limbs: Secondary | ICD-10-CM | POA: Diagnosis not present

## 2024-03-13 DIAGNOSIS — Z23 Encounter for immunization: Secondary | ICD-10-CM | POA: Diagnosis not present

## 2024-03-19 DIAGNOSIS — E039 Hypothyroidism, unspecified: Secondary | ICD-10-CM | POA: Diagnosis not present

## 2024-03-19 DIAGNOSIS — M1A9XX0 Chronic gout, unspecified, without tophus (tophi): Secondary | ICD-10-CM | POA: Diagnosis not present

## 2024-03-19 DIAGNOSIS — Z79899 Other long term (current) drug therapy: Secondary | ICD-10-CM | POA: Diagnosis not present

## 2024-03-19 DIAGNOSIS — E559 Vitamin D deficiency, unspecified: Secondary | ICD-10-CM | POA: Diagnosis not present

## 2024-03-19 DIAGNOSIS — E1122 Type 2 diabetes mellitus with diabetic chronic kidney disease: Secondary | ICD-10-CM | POA: Diagnosis not present

## 2024-03-19 DIAGNOSIS — E785 Hyperlipidemia, unspecified: Secondary | ICD-10-CM | POA: Diagnosis not present

## 2024-03-19 DIAGNOSIS — J309 Allergic rhinitis, unspecified: Secondary | ICD-10-CM | POA: Diagnosis not present

## 2024-03-19 DIAGNOSIS — N1832 Chronic kidney disease, stage 3b: Secondary | ICD-10-CM | POA: Diagnosis not present

## 2024-03-19 DIAGNOSIS — Z6825 Body mass index (BMI) 25.0-25.9, adult: Secondary | ICD-10-CM | POA: Diagnosis not present

## 2024-04-05 DIAGNOSIS — M1990 Unspecified osteoarthritis, unspecified site: Secondary | ICD-10-CM | POA: Diagnosis not present

## 2024-04-05 DIAGNOSIS — Z87891 Personal history of nicotine dependence: Secondary | ICD-10-CM | POA: Diagnosis not present

## 2024-04-05 DIAGNOSIS — M8580 Other specified disorders of bone density and structure, unspecified site: Secondary | ICD-10-CM | POA: Diagnosis not present

## 2024-04-05 DIAGNOSIS — Z86718 Personal history of other venous thrombosis and embolism: Secondary | ICD-10-CM | POA: Diagnosis not present

## 2024-04-05 DIAGNOSIS — R0683 Snoring: Secondary | ICD-10-CM | POA: Diagnosis not present

## 2024-04-05 DIAGNOSIS — K219 Gastro-esophageal reflux disease without esophagitis: Secondary | ICD-10-CM | POA: Diagnosis not present

## 2024-04-05 DIAGNOSIS — E1122 Type 2 diabetes mellitus with diabetic chronic kidney disease: Secondary | ICD-10-CM | POA: Diagnosis not present

## 2024-04-05 DIAGNOSIS — M109 Gout, unspecified: Secondary | ICD-10-CM | POA: Diagnosis not present

## 2024-04-05 DIAGNOSIS — E559 Vitamin D deficiency, unspecified: Secondary | ICD-10-CM | POA: Diagnosis not present

## 2024-04-05 DIAGNOSIS — Z8673 Personal history of transient ischemic attack (TIA), and cerebral infarction without residual deficits: Secondary | ICD-10-CM | POA: Diagnosis not present

## 2024-04-05 DIAGNOSIS — Z7985 Long-term (current) use of injectable non-insulin antidiabetic drugs: Secondary | ICD-10-CM | POA: Diagnosis not present

## 2024-04-05 DIAGNOSIS — G5602 Carpal tunnel syndrome, left upper limb: Secondary | ICD-10-CM | POA: Diagnosis not present

## 2024-04-05 DIAGNOSIS — N183 Chronic kidney disease, stage 3 unspecified: Secondary | ICD-10-CM | POA: Diagnosis not present

## 2024-04-05 DIAGNOSIS — Z79899 Other long term (current) drug therapy: Secondary | ICD-10-CM | POA: Diagnosis not present

## 2024-04-05 DIAGNOSIS — E785 Hyperlipidemia, unspecified: Secondary | ICD-10-CM | POA: Diagnosis not present

## 2024-05-20 ENCOUNTER — Ambulatory Visit (INDEPENDENT_AMBULATORY_CARE_PROVIDER_SITE_OTHER): Admitting: Podiatry

## 2024-05-20 DIAGNOSIS — E1122 Type 2 diabetes mellitus with diabetic chronic kidney disease: Secondary | ICD-10-CM

## 2024-05-20 DIAGNOSIS — L84 Corns and callosities: Secondary | ICD-10-CM | POA: Diagnosis not present

## 2024-05-20 DIAGNOSIS — N183 Chronic kidney disease, stage 3 unspecified: Secondary | ICD-10-CM | POA: Diagnosis not present

## 2024-05-20 DIAGNOSIS — M205X1 Other deformities of toe(s) (acquired), right foot: Secondary | ICD-10-CM

## 2024-05-20 NOTE — Progress Notes (Signed)
 "   Chief Complaint  Patient presents with   Callouses    Corn on the Right 5th toe, medial side. Pianfull to walk on and is causing the skin on the 4th lateral toe to be red.  A1c was 6.7 she thinks, was told she was in range.  No anti coag.  She will also bring  mean updated med list.    Discussed the use of AI scribe software for clinical note transcription with the patient, who gave verbal consent to proceed.  History of Present Illness Amanda Warner is an 89 year old female with diabetes who presents for evaluation of a painful corn on the medial aspect of the right fifth toe.  She reports a recurrent, painful corn on the medial aspect of the right fifth toe, which becomes symptomatic annually with the transition from sandals to closed shoes. Pain is localized to the right fifth toe without radiation and is aggravated by pressure from footwear, particularly clogs, and by the toe curling and tucking under the fourth toe. No symptoms are present in the contralateral foot or other toes.  She manages the corn conservatively at home with moisturizing foot cream, a non-medicated corn wrap, and occasional use of a gel sleeve for protection. She avoids aggressive treatments due to concerns about infection related to her diabetes. She has not previously consulted a podiatrist for this issue. The corn resolves with a return to sandals in warmer months but recurs each fall with closed shoes.  She has not used toe spacers regularly but has tried a gel sleeve, which provides partial relief but is difficult to maintain in position. She typically uses swimming to help loosen the corn for easier removal, but has been unable to participate in aquatic exercise recently due to hand surgery and lack of facility heating.  She also reports loss of the nail on the right hallux and progressive brittleness and flaking of her toenails, which she attributes to aging, thyroid  disease, and medication effects. She is  currently losing another toenail and remains concerned about ongoing nail dystrophy.   Past Medical History:  Diagnosis Date   Arthritis    Chronic kidney disease    3 th stage kidney disease   Complication of anesthesia    Diabetes mellitus without complication (HCC)    no meds   Dyspnea    Edema    GERD (gastroesophageal reflux disease)    Gout    Hx of blood clots    lt leg   Hypothyroidism    IBS (irritable bowel syndrome)    Pneumonia    PONV (postoperative nausea and vomiting)    Past Surgical History:  Procedure Laterality Date   ABDOMINAL HYSTERECTOMY     ANTERIOR LAT LUMBAR FUSION N/A 03/20/2017   Procedure: Lumbar two-five Transpsoas lumbar interbody fusion, Lumbar two-five laminectomy for decompression with posterior segmental instrumented fusion/Mazor;  Surgeon: Ditty, Morene Hicks, MD;  Location: [facility] OR;  Service: Neurosurgery;  Laterality: N/A;   APPLICATION OF ROBOTIC ASSISTANCE FOR SPINAL PROCEDURE N/A 03/20/2017   Procedure: APPLICATION OF ROBOTIC ASSISTANCE FOR SPINAL PROCEDURE;  Surgeon: Ditty, Morene Hicks, MD;  Location: [facility] OR;  Service: Neurosurgery;  Laterality: N/A;   DIAGNOSTIC LAPAROSCOPY     HERNIA REPAIR     JOINT REPLACEMENT     bil knees   TOTAL KNEE ARTHROPLASTY Right 05/19/2014   Procedure: RIGHT TOTAL KNEE ARTHROPLASTY;  Surgeon: Marcey Raman, MD;  Location: [facility] OR;  Service: Orthopedics;  Laterality: Right;  VEIN LIGATION AND STRIPPING     Allergies[1]  Physical Exam EXTREMITIES: Corn on medial aspect of right fifth toe. MUSCULOSKELETAL: Adductovarus deformity of right fourth and fifth toes. SKIN: No ulceration noted.  No erythema or edema appreciated  Results Paring of corn, right fifth toe Hyperkeratotic lesion on medial aspect of right fifth toe pared with sterile #15 blade. No ulceration present.  Patient did review and sign an soil scientist for her secondary insurance regarding paring of the corn in case her insurance later  determines that this is not medically necessary.   Assessment/Plan of Care: 1. Adductovarus rotation of toe, acquired, right   2. Corns   3. Type 2 diabetes mellitus with stage 3 chronic kidney disease, without long-term current use of insulin , unspecified whether stage 3a or 3b CKD (HCC)     Assessment & Plan Corn on medial right fifth toe Recurrent, symptomatic corn on the medial aspect of the right fifth toe at the distal interphalangeal joint, chronic and associated with pressure and friction from footwear, exacerbated by transition from sandals to closed shoes. No ulceration. The corn is secondary to underlying digital position and osseous structure. Conservative management is preferred due to her diabetes and increased infection risk. - Applied topical medication (Salinocaine) to discourage regrowth after the lesion was shaved with a sterile #313 blade to remove the hyperkeratotic buildup.. - Provided gel toe spacers and corn wraps for offloading and recurrence reduction. - Advised moisturizing the area to prevent hyperkeratosis. - Instructed continued use of protective sleeves or wraps as tolerated. - Recommended roomy footwear to minimize digital pressure.  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 Conservative management is preferred; surgical correction remains an option if conservative measures fail, though most do not require or desire surgery at this time. - Provided gel toe spacers to reduce friction and offload pressure between the fourth and fifth toes. - Discussed conservative management including shaving, spacers, and appropriate footwear as the preferred approach. - Reviewed surgical correction as an option if conservative measures are unsuccessful.  Follow-up as needed  [name], DPM, FACFAS [facility]      [address] [place], [place] [address]                Office [phone]  Fax [phone]      [1]  Allergies Allergen Reactions   Penicillins Anaphylaxis and Swelling   Allopurinol Other (See Comments)    Liver damage   Codeine Hives and Other (See Comments)    Sedation    Doxycycline Hives   Sudafed [Pseudoephedrine Hcl] Hives   Sulfa Antibiotics Hives   Tramadol Hives   "
